# Patient Record
Sex: Female | Born: 1990 | Race: White | Hispanic: Yes | Marital: Single | Smoking: Former smoker
Health system: Southern US, Community
[De-identification: ages and names within clinical notes are randomized; demographics above are authoritative.]

## PROBLEM LIST (undated history)

## (undated) ENCOUNTER — Inpatient Hospital Stay (HOSPITAL_COMMUNITY): Payer: Self-pay

## (undated) DIAGNOSIS — F32A Depression, unspecified: Secondary | ICD-10-CM

## (undated) DIAGNOSIS — R87629 Unspecified abnormal cytological findings in specimens from vagina: Secondary | ICD-10-CM

## (undated) DIAGNOSIS — M549 Dorsalgia, unspecified: Secondary | ICD-10-CM

## (undated) DIAGNOSIS — N390 Urinary tract infection, site not specified: Secondary | ICD-10-CM

## (undated) DIAGNOSIS — M329 Systemic lupus erythematosus, unspecified: Secondary | ICD-10-CM

## (undated) DIAGNOSIS — G8929 Other chronic pain: Secondary | ICD-10-CM

## (undated) DIAGNOSIS — O24419 Gestational diabetes mellitus in pregnancy, unspecified control: Secondary | ICD-10-CM

## (undated) DIAGNOSIS — F419 Anxiety disorder, unspecified: Secondary | ICD-10-CM

## (undated) DIAGNOSIS — IMO0002 Reserved for concepts with insufficient information to code with codable children: Secondary | ICD-10-CM

## (undated) DIAGNOSIS — D649 Anemia, unspecified: Secondary | ICD-10-CM

## (undated) DIAGNOSIS — M1990 Unspecified osteoarthritis, unspecified site: Secondary | ICD-10-CM

## (undated) HISTORY — DX: Unspecified osteoarthritis, unspecified site: M19.90

## (undated) HISTORY — DX: Unspecified abnormal cytological findings in specimens from vagina: R87.629

## (undated) HISTORY — PX: HERNIA REPAIR: SHX51

## (undated) HISTORY — DX: Depression, unspecified: F32.A

---
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
## 2012-02-02 ENCOUNTER — Inpatient Hospital Stay (HOSPITAL_COMMUNITY)
Admission: AD | Admit: 2012-02-02 | Discharge: 2012-02-02 | Disposition: A | Discharge status: Home / Self Care | Payer: Self-pay | Source: Ambulatory Visit | Attending: Obstetrics & Gynecology | Admitting: Obstetrics & Gynecology

## 2012-02-02 ENCOUNTER — Inpatient Hospital Stay (HOSPITAL_COMMUNITY): Payer: Self-pay

## 2012-02-02 ENCOUNTER — Encounter (HOSPITAL_COMMUNITY): Payer: Self-pay | Admitting: *Deleted

## 2012-02-02 DIAGNOSIS — O26899 Other specified pregnancy related conditions, unspecified trimester: Secondary | ICD-10-CM

## 2012-02-02 DIAGNOSIS — O99891 Other specified diseases and conditions complicating pregnancy: Secondary | ICD-10-CM | POA: Insufficient documentation

## 2012-02-02 DIAGNOSIS — R109 Unspecified abdominal pain: Secondary | ICD-10-CM | POA: Insufficient documentation

## 2012-02-02 LAB — URINALYSIS, ROUTINE W REFLEX MICROSCOPIC
Bilirubin Urine: NEGATIVE
Glucose, UA: NEGATIVE mg/dL
Ketones, ur: NEGATIVE mg/dL
pH: 6 (ref 5.0–8.0)

## 2012-02-02 LAB — HCG, QUANTITATIVE, PREGNANCY: hCG, Beta Chain, Quant, S: 44 m[IU]/mL — ABNORMAL HIGH (ref <5)

## 2012-02-02 LAB — CBC
HCT: 32.6 % — ABNORMAL LOW (ref 36.0–46.0)
Hemoglobin: 10.5 g/dL — ABNORMAL LOW (ref 12.0–15.0)
MCV: 76.2 fL — ABNORMAL LOW (ref 78.0–100.0)
RBC: 4.28 MIL/uL (ref 3.87–5.11)
RDW: 15.7 % — ABNORMAL HIGH (ref 11.5–15.5)
WBC: 6.3 10*3/uL (ref 4.0–10.5)

## 2012-02-02 LAB — WET PREP, GENITAL
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
## 2012-02-02 NOTE — MAU Note (Signed)
Patient states she had a positive home pregnancy test. Has been having abdominal cramping, no bleeding.

## 2012-02-02 NOTE — MAU Note (Signed)
Pt asked if she can continue to breastfeed with the pregnancy-instructed tht she could per N.Frazier,CNM

## 2012-02-02 NOTE — MAU Provider Note (Signed)
Attestation of Attending Supervision of Advanced Practitioner (CNM/NP): Evaluation and management procedures were performed by the Advanced Practitioner under my supervision and collaboration.  I have reviewed the Advanced Practitioner's note and chart, and I agree with the management and plan.  HARRAWAY-SMITH, Jarquis Walker 11:40 PM   \  

## 2012-02-03 LAB — GC/CHLAMYDIA PROBE AMP, GENITAL: Chlamydia, DNA Probe: NEGATIVE

## 2012-02-05 ENCOUNTER — Inpatient Hospital Stay (HOSPITAL_COMMUNITY)
Admission: AD | Admit: 2012-02-05 | Discharge: 2012-02-05 | Disposition: A | Discharge status: Home / Self Care | Payer: Self-pay | Source: Ambulatory Visit | Attending: Obstetrics & Gynecology | Admitting: Obstetrics & Gynecology

## 2012-02-05 DIAGNOSIS — O99891 Other specified diseases and conditions complicating pregnancy: Secondary | ICD-10-CM | POA: Insufficient documentation

## 2012-02-05 DIAGNOSIS — R109 Unspecified abdominal pain: Secondary | ICD-10-CM | POA: Insufficient documentation

## 2012-02-05 DIAGNOSIS — Z09 Encounter for follow-up examination after completed treatment for conditions other than malignant neoplasm: Secondary | ICD-10-CM

## 2012-02-05 LAB — HCG, QUANTITATIVE, PREGNANCY: hCG, Beta Chain, Quant, S: 228 m[IU]/mL — ABNORMAL HIGH (ref <5)

## 2012-02-05 NOTE — MAU Note (Signed)
Pt reports cramping off/on, denies bleeding.

## 2012-02-05 NOTE — MAU Provider Note (Signed)
Tara Villanueva is a 21 y.o. female who presents to MAU for follow up Bhcg. On her previous visit 2 days ago the Bhcg was 44. Today has increased to 228. The patient has no pain or bleeding today.   Discussed with Dr. Penne Lash. Will have the patient return in 10 days for follow up ultrasound. She will continue ectopic precautions.

## 2012-02-15 NOTE — MAU Provider Note (Signed)
Attestation of Attending Supervision of Advanced Practitioner (CNM/NP): Evaluation and management procedures were performed by the Advanced Practitioner under my supervision and collaboration. I have reviewed the Advanced Practitioner's note and chart, and I agree with the management and plan.  Waneta Fitting H. 2:46 PM   

## 2012-02-17 ENCOUNTER — Inpatient Hospital Stay (HOSPITAL_COMMUNITY)
Admission: AD | Admit: 2012-02-17 | Discharge: 2012-02-17 | Disposition: A | Discharge status: Home / Self Care | Payer: Self-pay | Source: Ambulatory Visit | Attending: Obstetrics & Gynecology | Admitting: Obstetrics & Gynecology

## 2012-02-17 ENCOUNTER — Encounter (HOSPITAL_COMMUNITY): Payer: Self-pay | Admitting: *Deleted

## 2012-02-17 ENCOUNTER — Ambulatory Visit (HOSPITAL_COMMUNITY)
Admission: RE | Admit: 2012-02-17 | Discharge: 2012-02-17 | Disposition: A | Discharge status: Home / Self Care | Payer: Self-pay | Source: Ambulatory Visit | Attending: Nurse Practitioner | Admitting: Nurse Practitioner

## 2012-02-17 DIAGNOSIS — B3731 Acute candidiasis of vulva and vagina: Secondary | ICD-10-CM | POA: Insufficient documentation

## 2012-02-17 DIAGNOSIS — O3680X0 Pregnancy with inconclusive fetal viability, not applicable or unspecified: Secondary | ICD-10-CM | POA: Insufficient documentation

## 2012-02-17 DIAGNOSIS — O2390 Unspecified genitourinary tract infection in pregnancy, unspecified trimester: Secondary | ICD-10-CM | POA: Insufficient documentation

## 2012-02-17 DIAGNOSIS — B373 Candidiasis of vulva and vagina: Secondary | ICD-10-CM

## 2012-02-17 DIAGNOSIS — L293 Anogenital pruritus, unspecified: Secondary | ICD-10-CM | POA: Insufficient documentation

## 2012-02-17 DIAGNOSIS — N949 Unspecified condition associated with female genital organs and menstrual cycle: Secondary | ICD-10-CM | POA: Insufficient documentation

## 2012-02-17 DIAGNOSIS — O99891 Other specified diseases and conditions complicating pregnancy: Secondary | ICD-10-CM | POA: Insufficient documentation

## 2012-02-17 DIAGNOSIS — Z3689 Encounter for other specified antenatal screening: Secondary | ICD-10-CM | POA: Insufficient documentation

## 2012-02-17 DIAGNOSIS — Z3490 Encounter for supervision of normal pregnancy, unspecified, unspecified trimester: Secondary | ICD-10-CM

## 2012-02-17 HISTORY — DX: Anxiety disorder, unspecified: F41.9

## 2012-02-17 NOTE — MAU Provider Note (Signed)
Attestation of Attending Supervision of Advanced Practitioner (CNM/NP): Evaluation and management procedures were performed by the Advanced Practitioner under my supervision and collaboration.  I have reviewed the Advanced Practitioner's note and chart, and I agree with the management and plan.  HARRAWAY-SMITH, Evonna Stoltz 10:21 PM     

## 2012-02-17 NOTE — MAU Note (Signed)
Came after [place].  Still having cramping.  No bleeding, heavy discharge, itching and some irritation.
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
## 2012-03-17 NOTE — L&D Delivery Note (Addendum)
Delivery Note At 3:29 AM a viable and healthy female was delivered via Vaginal, Spontaneous Delivery (Presentation: ; Occiput Anterior).  APGAR: 8,9 ; weight pending.   Placenta status: Intact, Spontaneous.  Cord:  with the following complications: .    Anesthesia:  none Episiotomy: none Lacerations: none Suture Repair: none Est. Blood Loss (mL): 300  Mom to postpartum.  Baby to nursery-stable.  Tawni Carnes 10/06/2012, 3:52 AM

## 2012-03-21 ENCOUNTER — Encounter (HOSPITAL_COMMUNITY): Payer: Self-pay | Admitting: *Deleted

## 2012-03-21 ENCOUNTER — Inpatient Hospital Stay (HOSPITAL_COMMUNITY)
Admission: AD | Admit: 2012-03-21 | Discharge: 2012-03-21 | Disposition: A | Discharge status: Home / Self Care | Payer: Self-pay | Source: Ambulatory Visit | Attending: Family Medicine | Admitting: Family Medicine

## 2012-03-21 DIAGNOSIS — B373 Candidiasis of vulva and vagina: Secondary | ICD-10-CM

## 2012-03-21 DIAGNOSIS — B3731 Acute candidiasis of vulva and vagina: Secondary | ICD-10-CM | POA: Insufficient documentation

## 2012-03-21 DIAGNOSIS — N949 Unspecified condition associated with female genital organs and menstrual cycle: Secondary | ICD-10-CM | POA: Insufficient documentation

## 2012-03-21 DIAGNOSIS — R109 Unspecified abdominal pain: Secondary | ICD-10-CM | POA: Insufficient documentation

## 2012-03-21 DIAGNOSIS — Z3490 Encounter for supervision of normal pregnancy, unspecified, unspecified trimester: Secondary | ICD-10-CM

## 2012-03-21 DIAGNOSIS — O2390 Unspecified genitourinary tract infection in pregnancy, unspecified trimester: Secondary | ICD-10-CM | POA: Insufficient documentation

## 2012-03-21 LAB — URINALYSIS, ROUTINE W REFLEX MICROSCOPIC
Bilirubin Urine: NEGATIVE
Nitrite: NEGATIVE
Specific Gravity, Urine: 1.02 (ref 1.005–1.030)
Urobilinogen, UA: 0.2 mg/dL (ref 0.0–1.0)
pH: 6 (ref 5.0–8.0)

## 2012-03-21 LAB — URINE MICROSCOPIC-ADD ON

## 2012-03-21 LAB — WET PREP, GENITAL
Clue Cells Wet Prep HPF POC: NONE SEEN
Trich, Wet Prep: NONE SEEN
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
## 2012-03-21 NOTE — MAU Note (Signed)
Patient left MAU stating she was going to go talk to her husband in the waiting room and never returned. Discharge paper were not signed.

## 2012-03-21 NOTE — MAU Note (Signed)
Pt G3P2 at 10.4wks having contraction pain x 2 days, passing clear/white watery discharge.

## 2012-03-22 NOTE — MAU Provider Note (Signed)
Chart reviewed and agree with management and plan.  

## 2012-04-14 LAB — OB RESULTS CONSOLE HGB/HCT, BLOOD: HCT: 33 %

## 2012-04-14 LAB — OB RESULTS CONSOLE HIV ANTIBODY (ROUTINE TESTING): HIV: NONREACTIVE

## 2012-04-14 LAB — OB RESULTS CONSOLE ABO/RH: RH Type: POSITIVE

## 2012-04-14 LAB — OB RESULTS CONSOLE HEPATITIS B SURFACE ANTIGEN: Hepatitis B Surface Ag: NEGATIVE

## 2012-04-14 LAB — OB RESULTS CONSOLE ANTIBODY SCREEN: Antibody Screen: NEGATIVE

## 2012-07-13 LAB — OB RESULTS CONSOLE PLATELET COUNT: Platelets: 255 10*3/uL

## 2012-07-13 LAB — OB RESULTS CONSOLE HGB/HCT, BLOOD
HCT: 28 %
Hemoglobin: 9.2 g/dL

## 2012-07-20 ENCOUNTER — Inpatient Hospital Stay (HOSPITAL_COMMUNITY)
Admission: AD | Admit: 2012-07-20 | Discharge: 2012-07-20 | Disposition: A | Discharge status: Home / Self Care | Payer: Self-pay | Source: Ambulatory Visit | Attending: Obstetrics & Gynecology | Admitting: Obstetrics & Gynecology

## 2012-07-20 ENCOUNTER — Encounter (HOSPITAL_COMMUNITY): Payer: Self-pay

## 2012-07-20 DIAGNOSIS — R109 Unspecified abdominal pain: Secondary | ICD-10-CM | POA: Insufficient documentation

## 2012-07-20 DIAGNOSIS — O479 False labor, unspecified: Secondary | ICD-10-CM

## 2012-07-20 DIAGNOSIS — O99891 Other specified diseases and conditions complicating pregnancy: Secondary | ICD-10-CM | POA: Insufficient documentation

## 2012-07-20 LAB — URINALYSIS, ROUTINE W REFLEX MICROSCOPIC
Glucose, UA: NEGATIVE mg/dL
Ketones, ur: NEGATIVE mg/dL
Nitrite: NEGATIVE
Protein, ur: NEGATIVE mg/dL
pH: 6 (ref 5.0–8.0)

## 2012-07-20 LAB — URINE MICROSCOPIC-ADD ON
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
## 2012-07-20 NOTE — MAU Note (Signed)
Been contracting (braxton hicks) past couple wks. Spotting yesterday, and a lot of white discharge. No hx of PTL. Was told she is carrying low.

## 2012-07-20 NOTE — MAU Note (Signed)
Pt also notes that she has experienced swelling in her right leg only. Sunday pt states she felt baby move then right leg got numb/tingly, and pt noted a bulge on right outer portion of her calf which was very painful. Does note slight pain in calf with dorsiflexion.

## 2012-07-20 NOTE — MAU Note (Signed)
Pt states goes to Dr. Arther Abbott in [facility] for [facility], plans on delivery at [facility]. Noted spotting yesterday only. Denies burning with voiding, however does have intermittent urinary incontinence.

## 2012-07-21 LAB — URINE CULTURE: Colony Count: NO GROWTH

## 2012-07-23 NOTE — MAU Provider Note (Signed)
Attestation of Attending Supervision of Advanced Practitioner (PA/CNM/NP): Evaluation and management procedures were performed by the Advanced Practitioner under my supervision and collaboration.  I have reviewed the Advanced Practitioner's note and chart, and I agree with the management and plan.  Dorrance Sellick, MD, FACOG Attending Obstetrician & Gynecologist Faculty Practice, [facility]  

## 2012-07-30 ENCOUNTER — Inpatient Hospital Stay (HOSPITAL_COMMUNITY)
Admission: AD | Admit: 2012-07-30 | Discharge: 2012-07-30 | Disposition: A | Discharge status: Home / Self Care | Payer: Self-pay | Source: Ambulatory Visit | Attending: Obstetrics & Gynecology | Admitting: Obstetrics & Gynecology

## 2012-07-30 ENCOUNTER — Encounter (HOSPITAL_COMMUNITY): Payer: Self-pay | Admitting: *Deleted

## 2012-07-30 DIAGNOSIS — IMO0002 Reserved for concepts with insufficient information to code with codable children: Secondary | ICD-10-CM | POA: Insufficient documentation

## 2012-07-30 DIAGNOSIS — O212 Late vomiting of pregnancy: Secondary | ICD-10-CM | POA: Insufficient documentation

## 2012-07-30 DIAGNOSIS — K529 Noninfective gastroenteritis and colitis, unspecified: Secondary | ICD-10-CM

## 2012-07-30 DIAGNOSIS — R197 Diarrhea, unspecified: Secondary | ICD-10-CM | POA: Insufficient documentation

## 2012-07-30 DIAGNOSIS — K5289 Other specified noninfective gastroenteritis and colitis: Secondary | ICD-10-CM

## 2012-07-30 LAB — COMPREHENSIVE METABOLIC PANEL
Albumin: 3 g/dL — ABNORMAL LOW (ref 3.5–5.2)
Alkaline Phosphatase: 89 U/L (ref 39–117)
BUN: 10 mg/dL (ref 6–23)
Chloride: 101 mEq/L (ref 96–112)
Creatinine, Ser: 0.52 mg/dL (ref 0.50–1.10)
GFR calc Af Amer: >90 mL/min (ref >90)
Glucose, Bld: 71 mg/dL (ref 70–99)
Total Bilirubin: 0.2 mg/dL — ABNORMAL LOW (ref 0.3–1.2)
Total Protein: 7.2 g/dL (ref 6.0–8.3)

## 2012-07-30 LAB — URINALYSIS, ROUTINE W REFLEX MICROSCOPIC
Glucose, UA: NEGATIVE mg/dL
Leukocytes, UA: NEGATIVE
Nitrite: NEGATIVE
Specific Gravity, Urine: 1.02 (ref 1.005–1.030)
pH: 7 (ref 5.0–8.0)

## 2012-07-30 LAB — CBC WITH DIFFERENTIAL/PLATELET
Basophils Absolute: 0 10*3/uL (ref 0.0–0.1)
Eosinophils Relative: 0 % (ref 0–5)
Lymphocytes Relative: 20 % (ref 12–46)
Lymphs Abs: 1.8 10*3/uL (ref 0.7–4.0)
MCV: 77.4 fL — ABNORMAL LOW (ref 78.0–100.0)
Neutro Abs: 6.2 10*3/uL (ref 1.7–7.7)
Platelets: 229 10*3/uL (ref 150–400)
RBC: 3.81 MIL/uL — ABNORMAL LOW (ref 3.87–5.11)
RDW: 15.5 % (ref 11.5–15.5)
WBC: 8.8 10*3/uL (ref 4.0–10.5)

## 2012-07-30 MED ORDER — ONDANSETRON HCL 4 MG/2ML IJ SOLN
4.0000 mg | INTRAMUSCULAR | Status: AC
  Administered 2012-07-30: 4 mg via INTRAVENOUS
  Filled 2012-07-30: qty 2

## 2012-07-30 MED ORDER — PROMETHAZINE HCL 50 MG PO TABS: 50.0000 mg | ORAL_TABLET | Freq: Four times a day (QID) | ORAL | Status: DC | PRN

## 2012-07-30 MED ORDER — POTASSIUM CHLORIDE CRYS ER 20 MEQ PO TBCR
40.0000 meq | EXTENDED_RELEASE_TABLET | Freq: Once | ORAL | Status: AC
  Administered 2012-07-30: 40 meq via ORAL
  Filled 2012-07-30: qty 2

## 2012-07-30 MED ORDER — LACTATED RINGERS IV BOLUS (SEPSIS)
1000.0000 mL | INTRAVENOUS | Status: AC
  Administered 2012-07-30: 1000 mL via INTRAVENOUS

## 2012-07-30 MED ORDER — PROMETHAZINE HCL 25 MG/ML IJ SOLN: 25.0000 mg | INTRAMUSCULAR | Status: DC
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
## 2012-07-30 NOTE — MAU Note (Signed)
Pt G3 P2 at 29.2wks vomiting since Sunday.  Vomiting x 5 today.  Sees Dr. Shawnie Pons for prenatal care, being worked up for possible salmonella.

## 2012-08-23 ENCOUNTER — Encounter (HOSPITAL_COMMUNITY): Payer: Self-pay | Admitting: Emergency Medicine

## 2012-08-23 ENCOUNTER — Inpatient Hospital Stay (HOSPITAL_COMMUNITY)
Admission: EM | Admit: 2012-08-23 | Discharge: 2012-08-23 | Disposition: A | Discharge status: Home / Self Care | Payer: Self-pay | Attending: Obstetrics & Gynecology | Admitting: Obstetrics & Gynecology

## 2012-08-23 DIAGNOSIS — O4700 False labor before 37 completed weeks of gestation, unspecified trimester: Secondary | ICD-10-CM | POA: Insufficient documentation

## 2012-08-23 DIAGNOSIS — R109 Unspecified abdominal pain: Secondary | ICD-10-CM | POA: Insufficient documentation

## 2012-08-23 LAB — URINALYSIS, ROUTINE W REFLEX MICROSCOPIC
Bilirubin Urine: NEGATIVE
Leukocytes, UA: NEGATIVE
Nitrite: NEGATIVE
Specific Gravity, Urine: <1.005 — ABNORMAL LOW (ref 1.005–1.030)
Urobilinogen, UA: 0.2 mg/dL (ref 0.0–1.0)
pH: 7 (ref 5.0–8.0)

## 2012-08-23 MED ORDER — LACTATED RINGERS IV SOLN
INTRAVENOUS | Status: DC
  Administered 2012-08-23: 14:00:00 via INTRAVENOUS

## 2012-08-23 MED ORDER — LACTATED RINGERS IV BOLUS (SEPSIS)
500.0000 mL | Freq: Once | INTRAVENOUS | Status: AC
  Administered 2012-08-23: 14:00:00 via INTRAVENOUS

## 2012-08-23 MED ORDER — LACTATED RINGERS IV BOLUS (SEPSIS)
500.0000 mL | Freq: Once | INTRAVENOUS | Status: AC
  Administered 2012-08-23: 500 mL via INTRAVENOUS

## 2012-08-23 MED ORDER — TERBUTALINE SULFATE 1 MG/ML IJ SOLN
0.2500 mg | Freq: Once | INTRAMUSCULAR | Status: AC
  Administered 2012-08-23: 0.25 mg via SUBCUTANEOUS
  Filled 2012-08-23: qty 1

## 2012-08-23 MED ORDER — BETAMETHASONE SOD PHOS & ACET 6 (3-3) MG/ML IJ SUSP
12.0000 mg | Freq: Once | INTRAMUSCULAR | Status: AC
  Administered 2012-08-23: 12 mg via INTRAMUSCULAR
  Filled 2012-08-23: qty 2

## 2012-08-23 NOTE — MAU Note (Signed)
Patient arrived by [facility] from [facility] ED with c/o right flank pain and pinching pain in the abdomen. IV of LR infusing with 20 angio in the right antecubital space.

## 2012-08-23 NOTE — Progress Notes (Signed)
Report given to Lupita Leash, RN in MAU regarding pt transfer to [facility] MAU. ED RN notified as well.

## 2012-08-23 NOTE — ED Notes (Signed)
When walking out of room, noted pt had phone out with timer on and was blowing, asked patient if she was having contractions and she said yes, states 5 minutes apart

## 2012-08-23 NOTE — ED Notes (Signed)
Pt presenting to ed with c/o right side flank pain that's sharp, cramping pain pt with positive nausea and vomiting x couple days. Pt is [redacted] weeks pregnant.
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
## 2012-08-23 NOTE — Progress Notes (Signed)
Pt 32wk 5d with EDC 10/12/12 seen by Dr. Carlyon Prows in [facility]. G3P2 Pt reports postive fetal movement, pt states "not really feeling contractions," pt denies vaginal bleeding or leaking of fluid. RN reported that ED MD SVE closed. ED MD stated that pt was having contractions around 2am every 5 min. Pt states they have decreased some.

## 2012-08-23 NOTE — ED Notes (Signed)
Pt states started vomiting around midnight, was treated for UTI a few weeks ago, states she took all antibiotics, states no symptoms then so doesn't know if returned, pt states she is unable to walk, states cannot walk to bathroom, states unable to walk since around 3 am

## 2012-08-23 NOTE — Progress Notes (Signed)
Called regarding 32w pt with c/o of flank pain. OB RR RN in route.

## 2012-08-23 NOTE — ED Notes (Signed)
Sharion Dove RN attempted to in an out cath pt for urine sample and was unable.
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
## 2012-08-23 NOTE — Progress Notes (Signed)
[facility] has seen patient and provided her with a list of primary care resources. °

## 2012-08-23 NOTE — ED Notes (Signed)
Rapid response RN to bedside, connected pt to fetal monitor. Reports that pt is having contractions 3 min apart.

## 2012-08-24 ENCOUNTER — Encounter (HOSPITAL_COMMUNITY): Payer: Self-pay | Admitting: *Deleted

## 2012-08-24 ENCOUNTER — Inpatient Hospital Stay (HOSPITAL_COMMUNITY)
Admission: AD | Admit: 2012-08-24 | Discharge: 2012-08-24 | Disposition: A | Discharge status: Home / Self Care | Payer: Self-pay | Source: Ambulatory Visit | Attending: Obstetrics & Gynecology | Admitting: Obstetrics & Gynecology

## 2012-08-24 DIAGNOSIS — R109 Unspecified abdominal pain: Secondary | ICD-10-CM | POA: Insufficient documentation

## 2012-08-24 DIAGNOSIS — O4700 False labor before 37 completed weeks of gestation, unspecified trimester: Secondary | ICD-10-CM | POA: Insufficient documentation

## 2012-08-24 LAB — WET PREP, GENITAL: Clue Cells Wet Prep HPF POC: NONE SEEN

## 2012-08-24 MED ORDER — BETAMETHASONE SOD PHOS & ACET 6 (3-3) MG/ML IJ SUSP
12.0000 mg | Freq: Once | INTRAMUSCULAR | Status: AC
  Administered 2012-08-24: 12 mg via INTRAMUSCULAR
  Filled 2012-08-24: qty 2

## 2012-08-24 MED ORDER — NIFEDIPINE 10 MG PO CAPS
20.0000 mg | ORAL_CAPSULE | Freq: Once | ORAL | Status: DC
  Filled 2012-08-24: qty 2
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
## 2012-08-24 NOTE — MAU Note (Signed)
Pt states she has been feeling discharge when the baby moves

## 2012-08-24 NOTE — MAU Note (Signed)
Pt not  In room

## 2012-08-24 NOTE — MAU Note (Signed)
Patient to MAU for second dose of Betamethasone. Patient states she is having contractions every 15 minutes and more uncomfortable and has a watery discharge that is more than yesterday. Reports less fetal movement than usual.

## 2012-08-24 NOTE — Progress Notes (Signed)
Pt states she has a headache that stays around 3

## 2012-08-25 LAB — URINE CULTURE

## 2012-08-26 NOTE — MAU Provider Note (Signed)
Attestation of Attending Supervision of Advanced Practitioner (PA/CNM/NP): Evaluation and management procedures were performed by the Advanced Practitioner under my supervision and collaboration.  I have reviewed the Advanced Practitioner's note and chart, and I agree with the management and plan.  Darianny Momon, MD, FACOG Attending Obstetrician & Gynecologist Faculty Practice, [facility]  

## 2012-09-14 ENCOUNTER — Encounter: Payer: Self-pay | Admitting: *Deleted

## 2012-09-16 ENCOUNTER — Ambulatory Visit (INDEPENDENT_AMBULATORY_CARE_PROVIDER_SITE_OTHER): Payer: Self-pay | Admitting: Obstetrics & Gynecology

## 2012-09-16 ENCOUNTER — Encounter: Payer: Self-pay | Admitting: Obstetrics & Gynecology

## 2012-09-16 VITALS — BP 112/69 | Temp 97.3°F | Wt 145.8 lb

## 2012-09-16 DIAGNOSIS — O98519 Other viral diseases complicating pregnancy, unspecified trimester: Secondary | ICD-10-CM

## 2012-09-16 DIAGNOSIS — O98513 Other viral diseases complicating pregnancy, third trimester: Secondary | ICD-10-CM

## 2012-09-16 DIAGNOSIS — B009 Herpesviral infection, unspecified: Secondary | ICD-10-CM

## 2012-09-16 LAB — OB RESULTS CONSOLE GC/CHLAMYDIA: Chlamydia: NEGATIVE

## 2012-09-16 LAB — OB RESULTS CONSOLE GBS: GBS: NEGATIVE

## 2012-09-16 NOTE — Patient Instructions (Signed)

## 2012-09-16 NOTE — Progress Notes (Signed)
P=91, Here for Initial ob, c/o edema in right foot only, c/o pinch like feeling at night straight up vagina, Given new patient information.  Discussed bmi/weight gain.

## 2012-09-16 NOTE — Progress Notes (Signed)
transfer from Dr. Shawnie Pons per her request, delivered here 11 mo ago. Did not have f/u 3 hr GTT after abnl 1 hr due to scheduling, child care. GC and CT today, GBS. Advised to have GTT, avoid sugar.

## 2012-09-19 LAB — CULTURE, BETA STREP (GROUP B ONLY)

## 2012-09-23 ENCOUNTER — Ambulatory Visit (INDEPENDENT_AMBULATORY_CARE_PROVIDER_SITE_OTHER): Payer: Self-pay | Admitting: Family Medicine

## 2012-09-23 VITALS — BP 126/72 | Temp 97.6°F | Wt 147.3 lb

## 2012-09-23 DIAGNOSIS — O98519 Other viral diseases complicating pregnancy, unspecified trimester: Secondary | ICD-10-CM

## 2012-09-23 DIAGNOSIS — B009 Herpesviral infection, unspecified: Secondary | ICD-10-CM

## 2012-09-23 DIAGNOSIS — Z3483 Encounter for supervision of other normal pregnancy, third trimester: Secondary | ICD-10-CM

## 2012-09-23 DIAGNOSIS — Z23 Encounter for immunization: Secondary | ICD-10-CM

## 2012-09-23 DIAGNOSIS — Z3480 Encounter for supervision of other normal pregnancy, unspecified trimester: Secondary | ICD-10-CM | POA: Insufficient documentation

## 2012-09-23 LAB — GLUCOSE, CAPILLARY: Glucose-Capillary: 92 mg/dL (ref 70–99)

## 2012-09-23 LAB — POCT URINALYSIS DIP (DEVICE)
Bilirubin Urine: NEGATIVE
Glucose, UA: NEGATIVE mg/dL
Nitrite: NEGATIVE
Urobilinogen, UA: 0.2 mg/dL (ref 0.0–1.0)

## 2012-09-23 MED ORDER — VALACYCLOVIR HCL 1 G PO TABS: 500.0000 mg | ORAL_TABLET | Freq: Two times a day (BID) | ORAL | Status: DC

## 2012-09-23 MED ORDER — TETANUS-DIPHTH-ACELL PERTUSSIS 5-2.5-18.5 LF-MCG/0.5 IM SUSP
0.5000 mL | Freq: Once | INTRAMUSCULAR | Status: AC
  Administered 2012-09-23: 0.5 mL via INTRAMUSCULAR

## 2012-09-23 NOTE — Progress Notes (Signed)
Start suppression.  Failed 1 hour--random BS today--94 Needs TDaP

## 2012-09-23 NOTE — Patient Instructions (Addendum)
Breastfeeding A change in hormones during your pregnancy causes growth of your breast tissue and an increase in number and size of milk ducts. The hormone prolactin allows proteins, sugars, and fats from your blood supply to make breast milk in your milk-producing glands. The hormone progesterone prevents breast milk from being released before the birth of your baby. After the birth of your baby, your progesterone level decreases allowing breast milk to be released. Thoughts of your baby, as well as his or her sucking or crying, can stimulate the release of milk from the milk-producing glands. Deciding to breastfeed (nurse) is one of the best choices you can make for you and your baby. The information that follows gives a brief review of the benefits, as well as other important skills to know about breastfeeding. BENEFITS OF BREASTFEEDING For your baby  The first milk (colostrum) helps your baby's digestive system function better.   There are antibodies in your milk that help your baby fight off infections.   Your baby has a lower incidence of asthma, allergies, and sudden infant death syndrome (SIDS).   The nutrients in breast milk are better for your baby than infant formulas.  Breast milk improves your baby's brain development.   Your baby will have less gas, colic, and constipation.  Your baby is less likely to develop other conditions, such as childhood obesity, asthma, or diabetes mellitus. For you  Breastfeeding helps develop a very special bond between you and your baby.   Breastfeeding is convenient, always available at the correct temperature, and costs nothing.   Breastfeeding helps to burn calories and helps you lose the weight gained during pregnancy.   Breastfeeding makes your uterus contract back down to normal size faster and slows bleeding following delivery.   Breastfeeding mothers have a lower risk of developing osteoporosis or breast or ovarian cancer later  in life.  BREASTFEEDING FREQUENCY  A healthy, full-term baby may breastfeed as often as every hour or space his or her feedings to every 3 hours. Breastfeeding frequency will vary from baby to baby.   Newborns should be fed no less than every 2 3 hours during the day and every 4 5 hours during the night. You should breastfeed a minimum of 8 feedings in a 24 hour period.  Awaken your baby to breastfeed if it has been 3 4 hours since the last feeding.  Breastfeed when you feel the need to reduce the fullness of your breasts or when your newborn shows signs of hunger. Signs that your baby may be hungry include:  Increased alertness or activity.  Stretching.  Movement of the head from side to side.  Movement of the head and opening of the mouth when the corner of the mouth or cheek is stroked (rooting).  Increased sucking sounds, smacking lips, cooing, sighing, or squeaking.  Hand-to-mouth movements.  Increased sucking of fingers or hands.  Fussing.  Intermittent crying.  Signs of extreme hunger will require calming and consoling before you try to feed your baby. Signs of extreme hunger may include:  Restlessness.  A loud, strong cry.  Screaming.  Frequent feeding will help you make more milk and will help prevent problems, such as sore nipples and engorgement of the breasts.  BREASTFEEDING   Whether lying down or sitting, be sure that the baby's abdomen is facing your abdomen.   Support your breast with 4 fingers under your breast and your thumb above your nipple. Make sure your fingers are well away from   your nipple and your baby's mouth.   Stroke your baby's lips gently with your finger or nipple.   When your baby's mouth is open wide enough, place all of your nipple and as much of the colored area around your nipple (areola) as possible into your baby's mouth.  More areola should be visible above his or her upper lip than below his or her lower lip.  Your  baby's tongue should be between his or her lower gum and your breast.  Ensure that your baby's mouth is correctly positioned around the nipple (latched). Your baby's lips should create a seal on your breast.  Signs that your baby has effectively latched onto your nipple include:  Tugging or sucking without pain.  Swallowing heard between sucks.  Absent click or smacking sound.  Muscle movement above and in front of his or her ears with sucking.  Your baby must suck about 2 3 minutes in order to get your milk. Allow your baby to feed on each breast as long as he or she wants. Nurse your baby until he or she unlatches or falls asleep at the first breast, then offer the second breast.  Signs that your baby is full and satisfied include:  A gradual decrease in the number of sucks or complete cessation of sucking.  Falling asleep.  Extension or relaxation of his or her body.  Retention of a small amount of milk in his or her mouth.  Letting go of your breast by himself or herself.  Signs of effective breastfeeding in you include:  Breasts that have increased firmness, weight, and size prior to feeding.  Breasts that are softer after nursing.  Increased milk volume, as well as a change in milk consistency and color by the 5th day of breastfeeding.  Breast fullness relieved by breastfeeding.  Nipples are not sore, cracked, or bleeding.  If needed, break the suction by putting your finger into the corner of your baby's mouth and sliding your finger between his or her gums. Then, remove your breast from his or her mouth.  It is common for babies to spit up a small amount after a feeding.  Babies often swallow air during feeding. This can make babies fussy. Burping your baby between breasts can help with this.  Vitamin D supplements are recommended for babies who get only breast milk.  Avoid using a pacifier during your baby's first 4 6 weeks.  Avoid supplemental feedings of  water, formula, or juice in place of breastfeeding. Breast milk is all the food your baby needs. It is not necessary for your baby to have water or formula. Your breasts will make more milk if supplemental feedings are avoided during the early weeks. HOW TO TELL WHETHER YOUR BABY IS GETTING ENOUGH BREAST MILK Wondering whether or not your baby is getting enough milk is a common concern among mothers. You can be assured that your baby is getting enough milk if:   Your baby is actively sucking and you hear swallowing.   Your baby seems relaxed and satisfied after a feeding.   Your baby nurses at least 8 12 times in a 24 hour time period.  During the first 3 5 days of age:  Your baby is wetting at least 3 5 diapers in a 24 hour period. The urine should be clear and pale yellow.  Your baby is having at least 3 4 stools in a 24 hour period. The stool should be soft and yellow.  At   5 7 days of age, your baby is having at least 3 6 stools in a 24 hour period. The stool should be seedy and yellow by 5 days of age.  Your baby has a weight loss less than 7 10% during the first 3 days of age.  Your baby does not lose weight after 3 7 days of age.  Your baby gains 4 7 ounces each week after he or she is 4 days of age.  Your baby gains weight by 5 days of age and is back to birth weight within 2 weeks. ENGORGEMENT In the first week after your baby is born, you may experience extremely full breasts (engorgement). When engorged, your breasts may feel heavy, warm, or tender to the touch. Engorgement peaks within 24 48 hours after delivery of your baby.  Engorgement may be reduced by:  Continuing to breastfeed.  Increasing the frequency of breastfeeding.  Taking warm showers or applying warm, moist heat to your breasts just before each feeding. This increases circulation and helps the milk flow.   Gently massaging your breast before and during the feedings. With your fingertips, massage from  your chest wall towards your nipple in a circular motion.   Ensuring that your baby empties at least one breast at every feeding. It also helps to start the next feeding on the opposite breast.   Expressing breast milk by hand or by using a breast pump to empty the breasts if your baby is sleepy, or not nursing well. You may also want to express milk if you are returning to work oryou feel you are getting engorged.  Ensuring your baby is latched on and positioned properly while breastfeeding. If you follow these suggestions, your engorgement should improve in 24 48 hours. If you are still experiencing difficulty, call your lactation consultant or caregiver.  CARING FOR YOURSELF Take care of your breasts.  Bathe or shower daily.   Avoid using soap on your nipples.   Wear a supportive bra. Avoid wearing underwire style bras.  Air dry your nipples for a 3 4minutes after each feeding.   Use only cotton bra pads to absorb breast milk leakage. Leaking of breast milk between feedings is normal.   Use only pure lanolin on your nipples after nursing. You do not need to wash it off before feeding your baby again. Another option is to express a few drops of breast milk and gently massage that milk into your nipples.  Continue breast self-awareness checks. Take care of yourself.  Eat healthy foods. Alternate 3 meals with 3 snacks.  Avoid foods that you notice affect your baby in a bad way.  Drink milk, fruit juice, and water to satisfy your thirst (about 8 glasses a day).   Rest often, relax, and take your prenatal vitamins to prevent fatigue, stress, and anemia.  Avoid chewing and smoking tobacco.  Avoid alcohol and drug use.  Take over-the-counter and prescribed medicine only as directed by your caregiver or pharmacist. You should always check with your caregiver or pharmacist before taking any new medicine, vitamin, or herbal supplement.  Know that pregnancy is possible while  breastfeeding. If desired, talk to your caregiver about family planning and safe birth control methods that may be used while breastfeeding. SEEK MEDICAL CARE IF:   You feel like you want to stop breastfeeding or have become frustrated with breastfeeding.  You have painful breasts or nipples.  Your nipples are cracked or bleeding.  Your breasts are red, tender,   or warm.  You have a swollen area on either breast.  You have a fever or chills.  You have nausea or vomiting.  You have drainage from your nipples.  Your breasts do not become full before feedings by the 5th day after delivery.  You feel sad and depressed.  Your baby is too sleepy to eat well.  Your baby is having trouble sleeping.   Your baby is wetting less than 3 diapers in a 24 hour period.  Your baby has less than 3 stools in a 24 hour period.  Your baby's skin or the white part of his or her eyes becomes more yellow.   Your baby is not gaining weight by 5 days of age. MAKE SURE YOU:   Understand these instructions.  Will watch your condition.  Will get help right away if you are not doing well or get worse. Document Released: 03/03/2005 Document Revised: 11/26/2011 Document Reviewed: 10/08/2011 Exit[employer] Patient Information 2014 [employer].  

## 2012-09-23 NOTE — Progress Notes (Signed)
P=98, 

## 2012-09-24 ENCOUNTER — Encounter (HOSPITAL_COMMUNITY): Payer: Self-pay | Admitting: *Deleted

## 2012-09-24 ENCOUNTER — Inpatient Hospital Stay (HOSPITAL_COMMUNITY)
Admission: AD | Admit: 2012-09-24 | Discharge: 2012-09-24 | Disposition: A | Discharge status: Home / Self Care | Payer: Self-pay | Source: Ambulatory Visit | Attending: Obstetrics & Gynecology | Admitting: Obstetrics & Gynecology

## 2012-09-24 DIAGNOSIS — Z3483 Encounter for supervision of other normal pregnancy, third trimester: Secondary | ICD-10-CM

## 2012-09-24 DIAGNOSIS — O99891 Other specified diseases and conditions complicating pregnancy: Secondary | ICD-10-CM | POA: Insufficient documentation

## 2012-09-24 DIAGNOSIS — N898 Other specified noninflammatory disorders of vagina: Secondary | ICD-10-CM | POA: Insufficient documentation

## 2012-09-24 DIAGNOSIS — N949 Unspecified condition associated with female genital organs and menstrual cycle: Secondary | ICD-10-CM | POA: Insufficient documentation
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
## 2012-09-24 NOTE — MAU Note (Signed)
PT SAYS   FEELS PRESSURE  SINCE 4 PM- THEN SHE FELT SMALL AMT FLUID COME OUT- SHE SMELT IT- NOT URINE.    THEN AT 9PM- FELT MORE FLUID .  Marland Kitchen  ON ARRIVAL - NO PAD.  FEELS SOME UC- SOME STRONG.   VE YESTERDAY-   1-2 CM.   DENIES HSV AND MRSA.

## 2012-09-25 NOTE — MAU Provider Note (Signed)
Attestation of Attending Supervision of Advanced Practitioner (PA/CNM/NP): Evaluation and management procedures were performed by the Advanced Practitioner under my supervision and collaboration.  I have reviewed the Advanced Practitioner's note and chart, and I agree with the management and plan.  Jennier Schissler, MD, FACOG Attending Obstetrician & Gynecologist Faculty Practice, [facility]  

## 2012-09-29 ENCOUNTER — Ambulatory Visit (INDEPENDENT_AMBULATORY_CARE_PROVIDER_SITE_OTHER): Payer: Self-pay | Admitting: Advanced Practice Midwife

## 2012-09-29 VITALS — BP 116/72 | Temp 97.6°F | Wt 147.1 lb

## 2012-09-29 DIAGNOSIS — O98519 Other viral diseases complicating pregnancy, unspecified trimester: Secondary | ICD-10-CM

## 2012-09-29 DIAGNOSIS — O99810 Abnormal glucose complicating pregnancy: Secondary | ICD-10-CM

## 2012-09-29 DIAGNOSIS — F5089 Other specified eating disorder: Secondary | ICD-10-CM

## 2012-09-29 LAB — POCT URINALYSIS DIP (DEVICE)
Ketones, ur: NEGATIVE mg/dL
Protein, ur: NEGATIVE mg/dL
Specific Gravity, Urine: 1.02 (ref 1.005–1.030)

## 2012-09-29 LAB — GLUCOSE, CAPILLARY: Glucose-Capillary: 70 mg/dL (ref 70–99)

## 2012-09-29 NOTE — Progress Notes (Signed)
Pulse- 94 Patient reports vaginal pressure

## 2012-09-29 NOTE — Patient Instructions (Addendum)
Vaginal Delivery  Your caregiver must first be sure you are in labor. Signs of labor include:   You may pass what is called "the mucus plug" before labor begins. This is a small amount of blood stained mucus.   Regular uterine contractions.   The time between contractions get closer together.   The discomfort and pain gradually gets more intense.   Pains are mostly located in the back.   Pains get worse when walking.   The cervix (the opening of the uterus) becomes thinner (begins to efface) and opens up (dilates).  Once you are in labor and admitted into the hospital or care center, your caregiver will do the following:   A complete physical examination.   Check your vital signs (blood pressure, pulse, temperature and the fetal heart rate).   Do a vaginal examination (using a sterile glove and lubricant) to determine:   The position (presentation) of the baby (head [vertex] or buttock first).   The level (station) of the baby's head in the birth canal.   The effacement and dilatation of the cervix.   You may have your pubic hair shaved and be given an enema depending on your caregiver and the circumstance.   An electronic monitor is usually placed on your abdomen. The monitor follows the length and intensity of the contractions, as well as the baby's heart rate.   Usually, your caregiver will insert an IV in your arm with a bottle of sugar water. This is done as a precaution so that medications can be given to you quickly during labor or delivery.  NORMAL LABOR AND DELIVERY IS DIVIDED UP INTO 3 STAGES:  First Stage  This is when regular contractions begin and the cervix begins to efface and dilate. This stage can last from 3 to 15 hours. The end of the first stage is when the cervix is 100% effaced and 10 centimeters dilated. Pain medications may be given by    Injection (morphine, demerol, etc.)    Regional anesthesia (spinal, caudal or epidural, anesthetics given in different locations of the spine). Paracervical pain medication may be given, which is an injection of and anesthetic on each side of the cervix.  A pregnant woman may request to have "Natural Childbirth" which is not to have any medications or anesthesia during her labor and delivery.  Second Stage  This is when the baby comes down through the birth canal (vagina) and is born. This can take 1 to 4 hours. As the baby's head comes down through the birth canal, you may feel like you are going to have a bowel movement. You will get the urge to bear down and push until the baby is delivered. As the baby's head is being delivered, the caregiver will decide if an episiotomy (a cut in the perineum and vagina area) is needed to prevent tearing of the tissue in this area. The episiotomy is sewn up after the delivery of the baby and placenta. Sometimes a mask with nitrous oxide is given for the mother to breath during the delivery of the baby to help if there is too much pain. The end of Stage 2 is when the baby is fully delivered. Then when the umbilical cord stops pulsating it is clamped and cut.  Third Stage  The third stage begins after the baby is completely delivered and ends after the placenta (afterbirth) is delivered. This usually takes 5 to 30 minutes. After the placenta is delivered, a medication is given   either by intravenous or injection to help contract the uterus and prevent bleeding. The third stage is not painful and pain medication is usually not necessary. If an episiotomy was done, it is repaired at this time.  After the delivery, the mother is watched and monitored closely for 1 to 2 hours to make sure there is no postpartum bleeding (hemorrhage). If there is a lot of bleeding, medication is given to contract the uterus and stop the bleeding.  Document Released: 12/11/2007 Document Revised: 11/26/2011 Document Reviewed: 12/11/2007   [employer] Patient Information 2014 [employer].

## 2012-09-29 NOTE — Progress Notes (Signed)
Patient wants to be induced. States was told she needed to be delivered before her due date by Dr Debroah Loop. Had glucola of 140, but missed multiple attempts at 3hr GTT. States has been following diet. Eats rice raw/dry.  Random glucose today was 70 (was fasting).  Would not recommend IOL before 40 weeks. Refuses to take Valtrex. Does not believe she has Herpes, despite + test. Does not like to take meds.

## 2012-10-03 ENCOUNTER — Inpatient Hospital Stay (HOSPITAL_COMMUNITY)
Admission: AD | Admit: 2012-10-03 | Discharge: 2012-10-03 | Disposition: A | Discharge status: Home / Self Care | Payer: MEDICAID | Source: Ambulatory Visit | Attending: Obstetrics & Gynecology | Admitting: Obstetrics & Gynecology

## 2012-10-03 ENCOUNTER — Encounter (HOSPITAL_COMMUNITY): Payer: Self-pay | Admitting: *Deleted

## 2012-10-03 DIAGNOSIS — O368190 Decreased fetal movements, unspecified trimester, not applicable or unspecified: Secondary | ICD-10-CM | POA: Insufficient documentation

## 2012-10-03 DIAGNOSIS — O368131 Decreased fetal movements, third trimester, fetus 1: Secondary | ICD-10-CM

## 2012-10-03 NOTE — MAU Provider Note (Addendum)
Faculty Practice OB/GYN Attending Note  Subjective:  Patient presented to MAU at [redacted]w[redacted]d for decreased FM. Denies contractions, LOF or vaginal bleeding.     Objective:  Blood pressure 114/63, pulse 96, temperature 98.6 F (37 C), temperature source Oral, resp. rate 16, height 5\' 1"  (1.549 m), weight 150 lb (68.04 kg), last menstrual period 01/01/2012. FHT  Baseline 140 bpm, moderate variability, +accelerations, no decelerations Toco: No contractions Cervix: 2/70/-1, unchanged from visit last week  Assessment & Plan:  22 y.o. [id number] at [redacted]w[redacted]d who presented with decreased fetal movement, now with reactive NST. No signs/symptoms of labor.   No other complaints or concerns.  Fetal movement and labor precautions reviewed. Will discharge to home.   Jaynie Collins, MD, FACOG Attending Obstetrician & Gynecologist Faculty Practice, [facility]

## 2012-10-03 NOTE — MAU Note (Signed)
Pt presents with complaints of a decrease in fetal movement. Denies any bleeding or leakage of fluid.

## 2012-10-03 NOTE — MAU Note (Signed)
While assessing pt, she tells me she feels her baby moving well now.

## 2012-10-06 ENCOUNTER — Encounter (HOSPITAL_COMMUNITY): Payer: Self-pay | Admitting: *Deleted

## 2012-10-06 ENCOUNTER — Inpatient Hospital Stay (HOSPITAL_COMMUNITY)
Admission: AD | Admit: 2012-10-06 | Discharge: 2012-10-07 | DRG: 774 | Disposition: A | Discharge status: Home / Self Care | Payer: Medicaid Other | Source: Ambulatory Visit | Attending: Obstetrics & Gynecology | Admitting: Obstetrics & Gynecology

## 2012-10-06 DIAGNOSIS — O98519 Other viral diseases complicating pregnancy, unspecified trimester: Secondary | ICD-10-CM

## 2012-10-06 DIAGNOSIS — A6000 Herpesviral infection of urogenital system, unspecified: Secondary | ICD-10-CM | POA: Diagnosis present

## 2012-10-06 DIAGNOSIS — IMO0001 Reserved for inherently not codable concepts without codable children: Secondary | ICD-10-CM

## 2012-10-06 LAB — CBC
HCT: 31.6 % — ABNORMAL LOW (ref 36.0–46.0)
Platelets: 248 10*3/uL (ref 150–400)
RDW: 16.2 % — ABNORMAL HIGH (ref 11.5–15.5)
WBC: 11.4 10*3/uL — ABNORMAL HIGH (ref 4.0–10.5)

## 2012-10-06 LAB — TYPE AND SCREEN

## 2012-10-06 MED ORDER — WITCH HAZEL-GLYCERIN EX PADS: 1.0000 "application " | MEDICATED_PAD | CUTANEOUS | Status: DC | PRN

## 2012-10-06 MED ORDER — ONDANSETRON HCL 4 MG PO TABS: 4.0000 mg | ORAL_TABLET | ORAL | Status: DC | PRN

## 2012-10-06 MED ORDER — ONDANSETRON HCL 4 MG/2ML IJ SOLN: 4.0000 mg | Freq: Four times a day (QID) | INTRAMUSCULAR | Status: DC | PRN

## 2012-10-06 MED ORDER — OXYCODONE-ACETAMINOPHEN 5-325 MG PO TABS: 1.0000 | ORAL_TABLET | ORAL | Status: DC | PRN

## 2012-10-06 MED ORDER — DIPHENHYDRAMINE HCL 25 MG PO CAPS: 25.0000 mg | ORAL_CAPSULE | Freq: Four times a day (QID) | ORAL | Status: DC | PRN

## 2012-10-06 MED ORDER — CITRIC ACID-SODIUM CITRATE 334-500 MG/5ML PO SOLN: 30.0000 mL | ORAL | Status: DC | PRN

## 2012-10-06 MED ORDER — ONDANSETRON HCL 4 MG/2ML IJ SOLN: 4.0000 mg | INTRAMUSCULAR | Status: DC | PRN

## 2012-10-06 MED ORDER — IBUPROFEN 600 MG PO TABS: 600.0000 mg | ORAL_TABLET | Freq: Four times a day (QID) | ORAL | Status: DC | PRN

## 2012-10-06 MED ORDER — OXYTOCIN BOLUS FROM INFUSION: 500.0000 mL | INTRAVENOUS | Status: DC

## 2012-10-06 MED ORDER — ZOLPIDEM TARTRATE 5 MG PO TABS: 5.0000 mg | ORAL_TABLET | Freq: Every evening | ORAL | Status: DC | PRN

## 2012-10-06 MED ORDER — LIDOCAINE HCL (PF) 1 % IJ SOLN
30.0000 mL | INTRAMUSCULAR | Status: DC | PRN
  Filled 2012-10-06 (×2): qty 30

## 2012-10-06 MED ORDER — IBUPROFEN 600 MG PO TABS
600.0000 mg | ORAL_TABLET | Freq: Four times a day (QID) | ORAL | Status: DC
  Administered 2012-10-06: 600 mg via ORAL
  Filled 2012-10-06 (×2): qty 1

## 2012-10-06 MED ORDER — LANOLIN HYDROUS EX OINT: TOPICAL_OINTMENT | CUTANEOUS | Status: DC | PRN

## 2012-10-06 MED ORDER — SENNOSIDES-DOCUSATE SODIUM 8.6-50 MG PO TABS: 2.0000 | ORAL_TABLET | Freq: Every day | ORAL | Status: DC

## 2012-10-06 MED ORDER — OXYTOCIN 40 UNITS IN LACTATED RINGERS INFUSION - SIMPLE MED
62.5000 mL/h | INTRAVENOUS | Status: DC
  Filled 2012-10-06: qty 1000

## 2012-10-06 MED ORDER — ACETAMINOPHEN 325 MG PO TABS: 650.0000 mg | ORAL_TABLET | ORAL | Status: DC | PRN

## 2012-10-06 MED ORDER — LACTATED RINGERS IV SOLN: 500.0000 mL | INTRAVENOUS | Status: DC | PRN

## 2012-10-06 MED ORDER — PRENATAL MULTIVITAMIN CH
1.0000 | ORAL_TABLET | Freq: Every day | ORAL | Status: DC
  Filled 2012-10-06: qty 1

## 2012-10-06 MED ORDER — BENZOCAINE-MENTHOL 20-0.5 % EX AERO: 1.0000 "application " | INHALATION_SPRAY | CUTANEOUS | Status: DC | PRN

## 2012-10-06 MED ORDER — LACTATED RINGERS IV SOLN: INTRAVENOUS | Status: DC

## 2012-10-06 MED ORDER — SIMETHICONE 80 MG PO CHEW: 80.0000 mg | CHEWABLE_TABLET | ORAL | Status: DC | PRN

## 2012-10-06 MED ORDER — PRENATAL MULTIVITAMIN CH
1.0000 | ORAL_TABLET | Freq: Every day | ORAL | Status: DC
  Administered 2012-10-06 – 2012-10-07 (×2): 1 via ORAL
  Filled 2012-10-06: qty 1

## 2012-10-06 MED ORDER — TETANUS-DIPHTH-ACELL PERTUSSIS 5-2.5-18.5 LF-MCG/0.5 IM SUSP: 0.5000 mL | Freq: Once | INTRAMUSCULAR | Status: DC

## 2012-10-06 MED ORDER — IBUPROFEN 600 MG PO TABS
600.0000 mg | ORAL_TABLET | Freq: Four times a day (QID) | ORAL | Status: DC
  Administered 2012-10-06: 600 mg via ORAL
  Filled 2012-10-06 (×3): qty 1

## 2012-10-06 MED ORDER — DIBUCAINE 1 % RE OINT: 1.0000 "application " | TOPICAL_OINTMENT | RECTAL | Status: DC | PRN

## 2012-10-06 NOTE — Progress Notes (Signed)
UR chart review completed.  
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
## 2012-10-06 NOTE — MAU Note (Signed)
SVE 6-100-0 station

## 2012-10-06 NOTE — Progress Notes (Signed)
  Pt transferred to room 123 via wheelchair, newborn in mothers arms. Fob at side.

## 2012-10-07 ENCOUNTER — Encounter: Payer: Self-pay | Admitting: Family Medicine

## 2012-10-07 ENCOUNTER — Encounter: Payer: Self-pay | Admitting: Obstetrics and Gynecology

## 2012-10-07 MED ORDER — IBUPROFEN 600 MG PO TABS: 600.0000 mg | ORAL_TABLET | Freq: Four times a day (QID) | ORAL | Status: DC

## 2012-10-07 NOTE — Discharge Summary (Signed)
Obstetric Discharge Summary Reason for Admission: onset of labor Prenatal Procedures: none Intrapartum Procedures: spontaneous vaginal delivery Postpartum Procedures: none Complications-Operative and Postpartum: none Hemoglobin  Date Value Range Status  10/06/2012 10.1* 12.0 - 15.0 g/dL Final  9/52/8413 9.2   Final     HCT  Date Value Range Status  10/06/2012 31.6* 36.0 - 46.0 % Final  07/13/2012 28   Final    Physical Exam:  General: alert, cooperative and no distress Lochia: appropriate Uterine Fundus: firm DVT Evaluation: No evidence of DVT seen on physical exam. No cords or calf tenderness. No significant calf/ankle edema.  Discharge Diagnoses: Term Pregnancy-delivered  Discharge Information: Date: 10/07/2012 Activity: pelvic rest Diet: routine Medications: PNV and Ibuprofen Condition: stable Instructions: refer to practice specific booklet Discharge to: home Follow-up Information   Follow up with [facility]. Schedule an appointment as soon as possible for a visit in 4 weeks. (Call and schedule an appt for 4 weeks from now)    Contact information:   [address] [place] [address] [phone]      Newborn Data: Live born female  Birth Weight: 6 lb 1.2 oz (2755 g) APGAR: 8, 9  Home with mother.  Breastfeeding, depo for contraception  Tawni Carnes 10/07/2012, 7:26 AM  I have seen and examined this patient and I agree with the above. Cam Hai 11:35 PM 10/12/2012
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
## 2012-10-08 ENCOUNTER — Ambulatory Visit: Payer: Self-pay

## 2012-10-08 NOTE — Lactation Note (Signed)
This note was copied from the chart of Tara Villanueva. Lactation Consultation Note  Patient Name: Tara Villanueva [id number] Date: 10/08/2012 Reason for consult: Follow-up assessment   Maternal Data    Feeding   LATCH Score/Interventions             Problem noted: Filling;Mild/Moderate discomfort        Lactation Tools Discussed/Used     Consult Status Consult Status: Complete  Experienced BF mom reports that baby is nursing well. Reports that her breasts are feeling fuller since last night. Reports that baby is able to soften breasts. No questions at present. To call prn  Pamelia Hoit 10/08/2012, 9:29 AM

## 2012-11-08 ENCOUNTER — Ambulatory Visit: Payer: Self-pay | Admitting: Obstetrics and Gynecology

## 2012-11-12 ENCOUNTER — Ambulatory Visit: Payer: Self-pay | Admitting: Obstetrics and Gynecology

## 2013-09-23 ENCOUNTER — Encounter (HOSPITAL_COMMUNITY): Payer: Self-pay | Admitting: Emergency Medicine

## 2013-09-23 ENCOUNTER — Emergency Department (HOSPITAL_COMMUNITY)
Admission: EM | Admit: 2013-09-23 | Discharge: 2013-09-23 | Disposition: A | Discharge status: Other Acute Inpatient Hospital | Payer: No Typology Code available for payment source | Attending: Emergency Medicine | Admitting: Emergency Medicine

## 2013-09-23 ENCOUNTER — Inpatient Hospital Stay (HOSPITAL_COMMUNITY)
Admission: AD | Admit: 2013-09-23 | Discharge: 2013-09-26 | DRG: 885 | Disposition: A | Discharge status: Home / Self Care | Payer: No Typology Code available for payment source | Source: Intra-hospital | Attending: Psychiatry | Admitting: Psychiatry

## 2013-09-23 ENCOUNTER — Encounter (HOSPITAL_COMMUNITY): Payer: Self-pay

## 2013-09-23 DIAGNOSIS — T391X1A Poisoning by 4-Aminophenol derivatives, accidental (unintentional), initial encounter: Secondary | ICD-10-CM

## 2013-09-23 DIAGNOSIS — F332 Major depressive disorder, recurrent severe without psychotic features: Secondary | ICD-10-CM | POA: Diagnosis present

## 2013-09-23 DIAGNOSIS — Z87891 Personal history of nicotine dependence: Secondary | ICD-10-CM | POA: Insufficient documentation

## 2013-09-23 DIAGNOSIS — F32A Depression, unspecified: Secondary | ICD-10-CM

## 2013-09-23 DIAGNOSIS — T391X4A Poisoning by 4-Aminophenol derivatives, undetermined, initial encounter: Secondary | ICD-10-CM

## 2013-09-23 DIAGNOSIS — M549 Dorsalgia, unspecified: Secondary | ICD-10-CM | POA: Diagnosis present

## 2013-09-23 DIAGNOSIS — F3289 Other specified depressive episodes: Secondary | ICD-10-CM | POA: Insufficient documentation

## 2013-09-23 DIAGNOSIS — G8929 Other chronic pain: Secondary | ICD-10-CM | POA: Insufficient documentation

## 2013-09-23 DIAGNOSIS — Z825 Family history of asthma and other chronic lower respiratory diseases: Secondary | ICD-10-CM | POA: Diagnosis not present

## 2013-09-23 DIAGNOSIS — T391X2S Poisoning by 4-Aminophenol derivatives, intentional self-harm, sequela: Secondary | ICD-10-CM

## 2013-09-23 DIAGNOSIS — Y929 Unspecified place or not applicable: Secondary | ICD-10-CM | POA: Insufficient documentation

## 2013-09-23 DIAGNOSIS — Z87440 Personal history of urinary (tract) infections: Secondary | ICD-10-CM | POA: Insufficient documentation

## 2013-09-23 DIAGNOSIS — M950 Acquired deformity of nose: Secondary | ICD-10-CM

## 2013-09-23 DIAGNOSIS — IMO0002 Reserved for concepts with insufficient information to code with codable children: Secondary | ICD-10-CM

## 2013-09-23 DIAGNOSIS — F411 Generalized anxiety disorder: Secondary | ICD-10-CM | POA: Diagnosis present

## 2013-09-23 DIAGNOSIS — F322 Major depressive disorder, single episode, severe without psychotic features: Secondary | ICD-10-CM | POA: Diagnosis present

## 2013-09-23 DIAGNOSIS — Z833 Family history of diabetes mellitus: Secondary | ICD-10-CM | POA: Diagnosis not present

## 2013-09-23 DIAGNOSIS — Z862 Personal history of diseases of the blood and blood-forming organs and certain disorders involving the immune mechanism: Secondary | ICD-10-CM | POA: Insufficient documentation

## 2013-09-23 DIAGNOSIS — F329 Major depressive disorder, single episode, unspecified: Secondary | ICD-10-CM | POA: Insufficient documentation

## 2013-09-23 DIAGNOSIS — Z8619 Personal history of other infectious and parasitic diseases: Secondary | ICD-10-CM | POA: Insufficient documentation

## 2013-09-23 DIAGNOSIS — T50902A Poisoning by unspecified drugs, medicaments and biological substances, intentional self-harm, initial encounter: Secondary | ICD-10-CM

## 2013-09-23 DIAGNOSIS — Y9389 Activity, other specified: Secondary | ICD-10-CM | POA: Insufficient documentation

## 2013-09-23 HISTORY — DX: Poisoning by 4-aminophenol derivatives, accidental (unintentional), initial encounter: T39.1X1A

## 2013-09-23 LAB — COMPREHENSIVE METABOLIC PANEL
ALBUMIN: 4.1 g/dL (ref 3.5–5.2)
ALK PHOS: 87 U/L (ref 39–117)
ALT: 25 U/L (ref 0–35)
ALT: 19 U/L (ref 0–35)
ANION GAP: 16 — AB (ref 5–15)
ANION GAP: 14 (ref 5–15)
AST: 41 U/L — ABNORMAL HIGH (ref 0–37)
AST: 23 U/L (ref 0–37)
Albumin: 3.7 g/dL (ref 3.5–5.2)
Alkaline Phosphatase: 92 U/L (ref 39–117)
BILIRUBIN TOTAL: 0.6 mg/dL (ref 0.3–1.2)
BUN: 12 mg/dL (ref 6–23)
BUN: 11 mg/dL (ref 6–23)
CALCIUM: 9.3 mg/dL (ref 8.4–10.5)
CHLORIDE: 105 meq/L (ref 96–112)
CO2: 18 mEq/L — ABNORMAL LOW (ref 19–32)
CO2: 19 meq/L (ref 19–32)
Calcium: 9 mg/dL (ref 8.4–10.5)
Chloride: 102 mEq/L (ref 96–112)
Creatinine, Ser: 0.65 mg/dL (ref 0.50–1.10)
Creatinine, Ser: 0.66 mg/dL (ref 0.50–1.10)
GFR calc non Af Amer: >90 mL/min (ref >90)
GFR calc non Af Amer: >90 mL/min (ref >90)
GLUCOSE: 116 mg/dL — AB (ref 70–99)
GLUCOSE: 115 mg/dL — AB (ref 70–99)
Potassium: 4.3 mEq/L (ref 3.7–5.3)
Potassium: 3.7 mEq/L (ref 3.7–5.3)
SODIUM: 136 meq/L — AB (ref 137–147)
Sodium: 138 mEq/L (ref 137–147)
TOTAL PROTEIN: 8.1 g/dL (ref 6.0–8.3)
TOTAL PROTEIN: 7.6 g/dL (ref 6.0–8.3)
Total Bilirubin: 0.6 mg/dL (ref 0.3–1.2)

## 2013-09-23 LAB — CBC WITH DIFFERENTIAL/PLATELET
BASOS PCT: 1 % (ref 0–1)
Basophils Absolute: 0.1 10*3/uL (ref 0.0–0.1)
EOS ABS: 0 10*3/uL (ref 0.0–0.7)
EOS PCT: 0 % (ref 0–5)
HCT: 38.7 % (ref 36.0–46.0)
Hemoglobin: 12.9 g/dL (ref 12.0–15.0)
LYMPHS ABS: 1 10*3/uL (ref 0.7–4.0)
Lymphocytes Relative: 20 % (ref 12–46)
MCH: 25.3 pg — AB (ref 26.0–34.0)
MCHC: 33.3 g/dL (ref 30.0–36.0)
MCV: 75.9 fL — AB (ref 78.0–100.0)
MONOS PCT: 6 % (ref 3–12)
Monocytes Absolute: 0.3 10*3/uL (ref 0.1–1.0)
NEUTROS PCT: 73 % (ref 43–77)
Neutro Abs: 3.5 10*3/uL (ref 1.7–7.7)
PLATELETS: 248 10*3/uL (ref 150–400)
RBC: 5.1 MIL/uL (ref 3.87–5.11)
RDW: 15.8 % — ABNORMAL HIGH (ref 11.5–15.5)
WBC: 4.8 10*3/uL (ref 4.0–10.5)

## 2013-09-23 LAB — URINALYSIS, ROUTINE W REFLEX MICROSCOPIC
Bilirubin Urine: NEGATIVE
GLUCOSE, UA: NEGATIVE mg/dL
HGB URINE DIPSTICK: NEGATIVE
Ketones, ur: NEGATIVE mg/dL
Nitrite: NEGATIVE
PH: 6 (ref 5.0–8.0)
PROTEIN: 30 mg/dL — AB
Urobilinogen, UA: 0.2 mg/dL (ref 0.0–1.0)

## 2013-09-23 LAB — RAPID URINE DRUG SCREEN, HOSP PERFORMED
Amphetamines: NOT DETECTED
Barbiturates: NOT DETECTED
Benzodiazepines: NOT DETECTED
Cocaine: NOT DETECTED
OPIATES: NOT DETECTED
Tetrahydrocannabinol: NOT DETECTED

## 2013-09-23 LAB — URINE MICROSCOPIC-ADD ON

## 2013-09-23 LAB — ACETAMINOPHEN LEVEL
ACETAMINOPHEN (TYLENOL), SERUM: 43 ug/mL — AB (ref 10–30)
Acetaminophen (Tylenol), Serum: 19.8 ug/mL (ref 10–30)

## 2013-09-23 LAB — PREGNANCY, URINE: Preg Test, Ur: NEGATIVE

## 2013-09-23 LAB — SALICYLATE LEVEL
Salicylate Lvl: <2 mg/dL — ABNORMAL LOW (ref 2.8–20.0)
Salicylate Lvl: <2 mg/dL — ABNORMAL LOW (ref 2.8–20.0)

## 2013-09-23 LAB — ETHANOL

## 2013-09-23 LAB — PROTIME-INR
INR: 1.14 (ref 0.00–1.49)
Prothrombin Time: 14.6 seconds (ref 11.6–15.2)

## 2013-09-23 MED ORDER — IBUPROFEN 200 MG PO TABS: 600.0000 mg | ORAL_TABLET | Freq: Three times a day (TID) | ORAL | Status: DC | PRN

## 2013-09-23 MED ORDER — MAGNESIUM HYDROXIDE 400 MG/5ML PO SUSP: 30.0000 mL | Freq: Every day | ORAL | Status: DC | PRN

## 2013-09-23 MED ORDER — ONDANSETRON HCL 4 MG PO TABS: 4.0000 mg | ORAL_TABLET | Freq: Three times a day (TID) | ORAL | Status: DC | PRN

## 2013-09-23 MED ORDER — ONDANSETRON HCL 4 MG/2ML IJ SOLN
4.0000 mg | INTRAMUSCULAR | Status: DC | PRN
  Filled 2013-09-23: qty 2

## 2013-09-23 MED ORDER — ALUM & MAG HYDROXIDE-SIMETH 200-200-20 MG/5ML PO SUSP: 30.0000 mL | ORAL | Status: DC | PRN

## 2013-09-23 MED ORDER — SODIUM CHLORIDE 0.9 % IV SOLN
INTRAVENOUS | Status: DC
  Administered 2013-09-23: 10:00:00 via INTRAVENOUS

## 2013-09-23 MED ORDER — SODIUM CHLORIDE 0.9 % IV BOLUS (SEPSIS)
500.0000 mL | Freq: Once | INTRAVENOUS | Status: AC
  Administered 2013-09-23: 500 mL via INTRAVENOUS

## 2013-09-23 MED ORDER — TRAZODONE HCL 50 MG PO TABS
50.0000 mg | ORAL_TABLET | Freq: Every day | ORAL | Status: DC
  Filled 2013-09-23 (×5): qty 1

## 2013-09-23 MED ORDER — LORAZEPAM 1 MG PO TABS: 1.0000 mg | ORAL_TABLET | Freq: Three times a day (TID) | ORAL | Status: DC | PRN

## 2013-09-23 NOTE — ED Notes (Signed)
Initial contact-pt A&Ox4. Moving all extremities equally. Speaking full, clear sentences. PIV clean, dry, patent, and intact. Pt has no other questions/concerns. Plan of care explained to patient. In NAD. Denies nausea or pain at this time.

## 2013-09-23 NOTE — ED Notes (Addendum)
Poison controlled called, not recommended to treat with mucomyst at this time.
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
## 2013-09-23 NOTE — ED Notes (Signed)
Pt at this time denies nausea. Reports it went away

## 2013-09-23 NOTE — ED Notes (Addendum)
Poison control called and spoke with Roshana.  Recommended pt get an EKG, cardiac monitor, CMP, tylenol level. Wait until we get tylenol level before possibly administering mucomyst.

## 2013-09-23 NOTE — Progress Notes (Signed)
[facility] CL provided pt with a [facility] [employer] Card application, highlighting [facility], to help patient establish primary care.  °

## 2013-09-23 NOTE — ED Notes (Signed)
No In and Out was done

## 2013-09-23 NOTE — ED Notes (Signed)
Bed: RESB Expected date:  Expected time:  Means of arrival:  Comments: EMS-OD 

## 2013-09-23 NOTE — ED Notes (Addendum)
Pt refuses nausea medication at this time. Does give consent for friend to visit. Does not want Connye BurkittJose Ramirez to have any information.

## 2013-09-23 NOTE — ED Notes (Addendum)
Poison control has signed off on patient d/t therapeutic acetaminophen level. Updated on patient's neuro status and current labs. No other questions/concerns.

## 2013-09-23 NOTE — ED Notes (Signed)
Patient ambulatory to secure hold ED. Belongings include 2 necklaces, license and one bracelet. Accompanied with NT.

## 2013-09-23 NOTE — ED Notes (Signed)
Patient ate 75 % of her lunch .
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
## 2013-09-23 NOTE — ED Notes (Signed)
[facility]. She left in no acute distress.
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
## 2013-09-24 NOTE — [facility] Group Notes (Signed)
[facility] Group Notes:  (Clinical Social Work)  09/24/2013   1:15-2:15PM  Summary of Progress/Problems:   The main focus of today's process group was for the patient to identify ways in which they have sabotaged their own mental health wellness/recovery.  Motivational interviewing and a handout were used to explore the benefits and costs of their self-sabotaging behavior as well as the benefits and costs of changing this behavior.  The Stages of Change were explained to the group using a handout, and patients identified where they are with regard to changing self-defeating behaviors.  The patient expressed she self-sabotages with people pleasing, putting everybody else before herself, and blaming herself if anything goes wrong.  She was very vocal in group.  Type of Therapy:  Process Group  Participation Level:  Active  Participation Quality:  Attentive, Sharing and Supportive  Affect:  Blunted  Cognitive:  Appropriate and Oriented  Insight:  Engaged  Engagement in Therapy:  Engaged  Modes of Intervention:  Education, Motivational Interviewing   Ambrose MantleMareida Grossman-Orr, LCSW 09/24/2013, 4:00pm

## 2013-09-24 NOTE — H&P (Signed)
I agree with the assessment and plan as noted
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
## 2013-09-24 NOTE — Progress Notes (Signed)
Tara Villanueva remains OOB UAL without diff today, tolerated well.. She is flat, quiet and avoidant.    A She completes her morning assessment and on it she writes  She denies SI within the past 24 hrs, she rates her depressionn and hopelessness  0-1 / 0-1  And says her DC planning is to " move to [place]".   R Safety is in place.

## 2013-09-24 NOTE — Progress Notes (Signed)
Psychoeducational Group Note  Date:  09/24/2013 Time: 0930  Group Topic/Focus:  Goals Group:   The focus of this group is to help patients establish daily goals to achieve during treatment and discuss how the patient can incorporate goal setting into their daily lives to aide in recovery.  Participation Level: Did Not Attend  Participation Quality:  Not Applicable  Affect:  Not Applicable  Cognitive:  Not Applicable  Insight:  Not Applicable  Engagement in Group: Not Applicable  Additional Comments:    Rich BraveDuke, Ottavio Norem Lynn 09/24/2013, 12:48 PM
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
## 2013-09-24 NOTE — Tx Team (Signed)
Initial Interdisciplinary Treatment Plan  PATIENT STRENGTHS: (choose at least two) Capable of independent living General fund of knowledge Supportive family/friends  PATIENT STRESSORS: Educational concerns Financial difficulties   PROBLEM LIST: Problem List/Patient Goals Date to be addressed Date deferred Reason deferred Estimated date of resolution  Anxiety 09/24/13     Marriage problems 09/24/13     Depression 09/24/13     Education 09/24/13                                    DISCHARGE CRITERIA:  Adequate post-discharge living arrangements Improved stabilization in mood, thinking, and/or behavior Motivation to continue treatment in a less acute level of care  PRELIMINARY DISCHARGE PLAN: Attend aftercare/continuing care group Outpatient therapy  PATIENT/FAMIILY INVOLVEMENT: This treatment plan has been presented to and reviewed with the patient, Gypsy BalsamKatherine Navarro.  The patient and family have been given the opportunity to ask questions and make suggestions.  Jacques Navyhillips, Faithlyn Recktenwald A 09/24/2013, 5:17 AM
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
## 2013-09-24 NOTE — Progress Notes (Signed)
Psychoeducational Group Note  Date: 09/24/2013 Time: 1015  Group Topic/Focus:  Identifying Needs:   The focus of this group is to help patients identify their personal needs that have been historically problematic and identify healthy behaviors to address their needs.  Participation Level:  Active  Participation Quality:  Appropriate  Affect:  Appropriate  Cognitive:  Appropriate  Insight:  Engaged  Engagement in Group:  Engaged  Additional Comments:    7/11/20151:08 PM Lorelei Heikkila, Joie BimlerPatricia Lynn

## 2013-09-25 MED ORDER — CLOTRIMAZOLE 1 % VA CREA
1.0000 | TOPICAL_CREAM | Freq: Every day | VAGINAL | Status: DC
  Administered 2013-09-25: 1 via VAGINAL
  Filled 2013-09-25: qty 45

## 2013-09-25 MED ORDER — CLOTRIMAZOLE 1 % EX CREA: TOPICAL_CREAM | Freq: Two times a day (BID) | CUTANEOUS | Status: DC

## 2013-09-25 NOTE — Progress Notes (Signed)
D Natalia LeatherwoodKatherine is UAL on the unit tolerated well. She says she is going home today and when MD explains to her that she will not go today, she gets very upset. After processing with this nurse, she is able to verbalize that she accepts she is not going home, that she will have to speak to MD  Tomorrow and  Treatment team and MD will reevaluate her situation and her behavior.   A Pt contacts for safety, denies SI and rates her depression and hopelessness  " 0/0 .   R Safety is in place nad poc moves forward.

## 2013-09-25 NOTE — Progress Notes (Signed)
Adult Psychoeducational Group Note  Date:  09/25/2013 Time:  10:20 PM  Group Topic/Focus:  Wrap-Up Group:   The focus of this group is to help patients review their daily goal of treatment and discuss progress on daily workbooks.  Participation Level:  Active  Participation Quality:  Appropriate and Attentive  Affect:  Appropriate  Cognitive:  Alert and Appropriate  Insight: Appropriate  Engagement in Group:  Engaged  Modes of Intervention:  Discussion, Education and Support  Additional Comments: Pt was active during this group and rated her day at a 10 out of 10. Pt stated she felt happy throughout the day and looks forward to going home and being with her kids.   Tara MoanJeffers, [name] 09/25/2013, 10:20 PM

## 2013-09-25 NOTE — Progress Notes (Signed)
[facility] MD Progress Note  09/25/2013 11:18 AM Tara Villanueva  MRN:  [id number]  Subjective:  "I am happy"   Spoke with pt and chart was reviewed. Pt has been active on the unit and attending groups.   One child is with her grandmother and the other 2 daughters are with her husband. States she really wants to see her children. She is anxious to see them. Denies depression. States her medicine is her kids. Wants to be d/c to be with her kids and get back to real life.   States she was upset and took the pills to help her sleep. After she took the pills the realized it might be too much. So she told her husband to take care of the kids and she might be knocked out. Pt continues to deny that it was a suicide attempt.   After d/c she is willing to f/up outpatient with a psychiatrist.   Diagnosis:  Mood disorder NOS  Total Time spent with patient: 30 minutes    ADL's:  Intact  Sleep: Fair  Appetite:  Fair  Suicidal Ideation:  denies Homicidal Ideation:  Denies  AEB (as evidenced by):  Psychiatric Specialty Exam: Physical Exam  ROS  Blood pressure 120/63, pulse 123, temperature 98.2 F (36.8 C), temperature source Oral, resp. rate 18, height 5' 2"  (1.575 m), weight 78.472 kg (173 lb), unknown if currently breastfeeding.Body mass index is 31.63 kg/(m^2).  General Appearance: Neat  Eye Contact::  Good  Speech:  Clear and Coherent  Volume:  Normal  Mood:  Euthymic  Affect:  Full Range  Thought Process:  Goal Directed  Orientation:  Full (Time, Place, and Person)  Thought Content:  WDL  Suicidal Thoughts:  No  Homicidal Thoughts:  No  Memory:  Immediate;   Good Recent;   Good Remote;   Good  Judgement:  Poor  Insight:  Fair  Psychomotor Activity:  Normal  Concentration:  Good  Recall:  Good  Fund of Knowledge:Good  Language: Good  Akathisia:  No  Handed:  Right  AIMS (if indicated):     Assets:  Communication Skills Desire for Improvement Social  Support Transportation  Sleep:  Number of Hours: 6.75   Musculoskeletal: Strength & Muscle Tone: within normal limits Gait & Station: normal Patient leans: N/A  Current Medications: Current Facility-Administered Medications  Medication Dose Route Frequency Provider Last Rate Last Dose  . alum & mag hydroxide-simeth (MAALOX/MYLANTA) 200-200-20 MG/5ML suspension 30 mL  30 mL Oral Q4H PRN Waylan Boga, NP      . magnesium hydroxide (MILK OF MAGNESIA) suspension 30 mL  30 mL Oral Daily PRN Waylan Boga, NP      . traZODone (DESYREL) tablet 50 mg  50 mg Oral QHS Waylan Boga, NP        Lab Results:  Results for orders placed during the hospital encounter of 09/23/13 (from the past 48 hour(s))  COMPREHENSIVE METABOLIC PANEL     Status: Abnormal   Collection Time    09/23/13 11:21 AM      Result Value Ref Range   Sodium 138  137 - 147 mEq/L   Potassium 3.7  3.7 - 5.3 mEq/L   Chloride 105  96 - 112 mEq/L   CO2 19  19 - 32 mEq/L   Glucose, Bld 115 (*) 70 - 99 mg/dL   BUN 11  6 - 23 mg/dL   Creatinine, Ser 0.66  0.50 - 1.10 mg/dL   Calcium 9.0  8.4 -  10.5 mg/dL   Total Protein 7.6  6.0 - 8.3 g/dL   Albumin 3.7  3.5 - 5.2 g/dL   AST 23  0 - 37 U/L   ALT 19  0 - 35 U/L   Alkaline Phosphatase 87  39 - 117 U/L   Total Bilirubin 0.6  0.3 - 1.2 mg/dL   GFR calc non Af Amer >90  >90 mL/min   GFR calc Af Amer >90  >90 mL/min   Comment: (NOTE)     The eGFR has been calculated using the CKD EPI equation.     This calculation has not been validated in all clinical situations.     eGFR's persistently <90 mL/min signify possible Chronic Kidney     Disease.   Anion gap 14  5 - 15  SALICYLATE LEVEL     Status: Abnormal   Collection Time    09/23/13 11:21 AM      Result Value Ref Range   Salicylate Lvl <4.0 (*) 2.8 - 20.0 mg/dL  ACETAMINOPHEN LEVEL     Status: None   Collection Time    09/23/13 11:21 AM      Result Value Ref Range   Acetaminophen (Tylenol), Serum 19.8  10 - 30 ug/mL    Comment:            THERAPEUTIC CONCENTRATIONS VARY     SIGNIFICANTLY. A RANGE OF 10-30     ug/mL MAY BE AN EFFECTIVE     CONCENTRATION FOR MANY PATIENTS.     HOWEVER, SOME ARE BEST TREATED     AT CONCENTRATIONS OUTSIDE THIS     RANGE.     ACETAMINOPHEN CONCENTRATIONS     >150 ug/mL AT 4 HOURS AFTER     INGESTION AND >50 ug/mL AT 12     HOURS AFTER INGESTION ARE     OFTEN ASSOCIATED WITH TOXIC     REACTIONS.    Physical Findings: AIMS: Facial and Oral Movements Muscles of Facial Expression: None, normal Lips and Perioral Area: None, normal Jaw: None, normal Tongue: None, normal,Extremity Movements Upper (arms, wrists, hands, fingers): None, normal Lower (legs, knees, ankles, toes): None, normal, Trunk Movements Neck, shoulders, hips: None, normal, Overall Severity Severity of abnormal movements (highest score from questions above): None, normal Incapacitation due to abnormal movements: None, normal Patient's awareness of abnormal movements (rate only patient's report): No Awareness, Dental Status Current problems with teeth and/or dentures?: Yes (cavities) Does patient usually wear dentures?: No  CIWA:  CIWA-Ar Total: 0 COWS:  COWS Total Score: 1  Assessment: Unclear if pt was attempting suicide by OD. ED notes indicate pt told her husband to "take care of the kids" after she took the pills. Pt continues to deny that it was a suicide attempt. New onset stressor is that husband has taken 2 of her children to an unknown location.   Treatment Plan Summary: Daily contact with patient to assess and evaluate symptoms and progress in treatment  Plan: Continue Trazodone 87m po qHS for sleep Pt is refusing treatment with an antidepressant at this time May consider d/c on Monday if can get collateral from her grandmother.   Medical Decision Making Problem Points:  Established problem, stable/improving (1) and Review of psycho-social stressors (1) Data Points:  Review of medication  regiment & side effects (2)  I certify that inpatient services furnished can reasonably be expected to improve the patient's condition.   Marquese Burkland, SLauderdale7/02/2014, 11:18 AM

## 2013-09-25 NOTE — Progress Notes (Signed)
Writer spoke with patient 1:1 in her room. She was observed sitting on the bed crying.Writer asked what was wrong and she reported that she needed to be discharged b/c her husband was taking the children. She reported to Clinical research associatewriter that she was told across the street at the ED that she could leave after being here 24 hours. Writer encouraged her to call and talk with family members to find out about her children but she reports that she already knows that he has them. Writer encouraged her to try and rest and once discharged she can work on this situation. She denies si/hi/a/v hallucinations. Safety maintained on unit with 15 min checks.

## 2013-09-25 NOTE — Progress Notes (Signed)
[facility] Group Notes:  (Nursing/MHT/Case Management/Adjunct)  Date:  09/25/2013  Time:  12:45 AM  Type of Therapy:  Psychoeducational Skills  Participation Level:  Active  Participation Quality:  Appropriate  Affect:  Depressed  Cognitive:  Appropriate  Insight:  Improving  Engagement in Group:  Improving  Modes of Intervention:  Education  Summary of Progress/Problems: The patient stated in group that she had a pretty good day despite the fact that her discharge was cancelled. She states that the staff across the street in the emergency room told her that she would be discharged today. In terms of the theme for the day, her coping skills are to be with her children.   Hazle CocaGOODMAN, Gloriana Piltz S 09/25/2013, 12:45 AM

## 2013-09-25 NOTE — Progress Notes (Signed)
Psychoeducational Group Note  Date:  09/25/2013 Time:  1015  Group Topic/Focus:  Making Healthy Choices:   The focus of this group is to help patients identify negative/unhealthy choices they were using prior to admission and identify positive/healthier coping strategies to replace them upon discharge.  Participation Level:  Active  Participation Quality:  Appropriate  Affect:  Appropriate  Cognitive:  Oriented  Insight:  Improving  Engagement in Group:  Engaged  Additional Comments:    Leathia Farnell A 09/25/2013 

## 2013-09-25 NOTE — [facility] Group Notes (Signed)
[facility] Group Notes:  (Clinical Social Work)  09/25/2013   1:15-2:15PM  Summary of Progress/Problems:  The main focus of today's process group was to   identify the patient's current support system and decide on other supports that can be put in place.  The picture on workbook was used to discuss why additional supports are needed.  An emphasis was placed on using counselor, doctor, therapy groups, 12-step groups, and problem-specific support groups to expand supports.   There was also an extensive discussion about what constitutes a healthy support versus an unhealthy support.  The patient expressed full comprehension of the concepts presented.  Current healthy supports include her grandmother and her kids.  She states that around her kids, nothing bad can happen to her because she develops "super powers".  She is willing to add a therapist, and possibly go to the church she has previously been at locally which is not judgmental.  She also stated that there is a group she used to attend with her grandma, and was very impressed by their level of support.  She may be willing to go there.  Type of Therapy:  Process Group  Participation Level:  Active  Participation Quality:  Attentive and Sharing  Affect:  Blunted  Cognitive:  Appropriate and Oriented  Insight:  Engaged  Engagement in Therapy:  Engaged  Modes of Intervention:  Education,  Support and [employer]  Nyema Hachey Grossman-Orr, LCSW 09/25/2013, 4:00pm

## 2013-09-25 NOTE — Progress Notes (Signed)
Psychoeducational Group Note  Date: 09/25/2013  Time: 0930  Group Topic/Focus:  Gratefulness: The focus of this group is to help patients identify what two things they are most grateful for in their lives. What helps ground them and to center them on their work to their recovery.  Participation Level: Active  Participation Quality: Appropriate  Affect: Appropriate  Cognitive: Oriented  Insight: Improving  Engagement in Group: Engaged  Additional Comments:  Romaine Maciolek A       

## 2013-09-26 DIAGNOSIS — F332 Major depressive disorder, recurrent severe without psychotic features: Principal | ICD-10-CM

## 2013-09-26 DIAGNOSIS — F411 Generalized anxiety disorder: Secondary | ICD-10-CM

## 2013-09-26 MED ORDER — CLOTRIMAZOLE 1 % VA CREA: 1.0000 | TOPICAL_CREAM | Freq: Every day | VAGINAL | Status: DC

## 2013-09-26 NOTE — [facility] Group Notes (Signed)
[facility]   09/26/2013 11:47 AM    Participation Quality:  Appropraite  Mood/Affect:  Appropriate  Depression Rating:  0  Anxiety Rating:  0  Thoughts of Suicide:  No  Will you contract for safety?   NA  Current AVH:  No  Plan for Discharge/Comments:  Patient attended discharge planning group and actively participated in group.  Patient will return to her home and [facility]. CSW provided all participants with daily workbook.   Transportation Means: Patient has transportation.   Supports:  Patient has a support system.   Calli Bashor, Joesph JulyQuylle Hairston

## 2013-09-26 NOTE — Progress Notes (Signed)
D:  Patient's self inventory sheet, patient has excellent sleep, good appetite, high energy level, good attention span.  Denied depression, anxiety and hopeless.  Denied withdrawals.  Denied SI.  Denied physical problems.  Plans to love herself first to love others better.  Looking to discharge soon.  Does have discharge plans.  No problems with medications after discharge. A:  Medications administered per MD orders.  Emotional support and encouragement given patient. R:  Denied SI and HI.  Denied A/V hallucinations.  Safety maintained with 15 minute checks.

## 2013-09-26 NOTE — Progress Notes (Signed)
Discharge Note:  Patient discharged home with family members.  Denied SI and HI.  Denied A/V hallucinations.  Denied pain.  Suicide prevention information given and discussed with patient who stated she understood and had no questions.  Patient stated she received all her belongings, clothing, toiletries, misc items, prescription, medication.  Patient stated she [facility] all [facility].

## 2013-09-26 NOTE — [facility] Suicide Risk Assessment (Signed)
[facility] INPATIENT:  Family/Significant Other Suicide Prevention Education  Suicide Prevention Education:  Education Completed; Tara Villanueva, Tara Bay ParkGrandmother, [phone];  has been identified by the patient as the family member/significant other with whom the patient will be residing, and identified as the person(s) who will aid the patient in the event of a mental health crisis (suicidal ideations/suicide attempt).  With written consent from the patient, the family member/significant other has been provided the following suicide prevention education, prior to the and/or following the discharge of the patient.  The suicide prevention education provided includes the following:  Suicide risk factors  Suicide prevention and interventions  National Suicide Hotline telephone number  [facility] assessment telephone number  [facility] Emergency Assistance 911  [facility] and/or Residential Mobile Crisis Unit telephone number  Request made of family/significant other to:  Remove weapons (e.g., guns, rifles, knives), all items previously/currently identified as safety concern.  Grandmother advised patient does not have access to weapons.   Remove drugs/medications (over-the-counter, prescriptions, illicit drugs), all items previously/currently identified as a safety concern.  The family member/significant other verbalizes understanding of the suicide prevention education information provided.  The family member/significant other agrees to remove the items of safety concern listed above.  Tara Villanueva, Tara Villanueva 09/26/2013, 11:42 AM

## 2013-09-26 NOTE — Progress Notes (Signed)
Patient has been up and active on the unit, attended group this evening and has voiced no complaints. She is hopeful to discharge on Monday. Patient currently denies having pain, -si/hi/a/v hall. Support and encouragement offered, safety maintained on unit, will continue to monitor.
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
## 2013-09-26 NOTE — Progress Notes (Signed)
[facility] :  Will you be returning to the same living situation after discharge: Yes,  Patient is returning to her home. At discharge, do you have transportation home?:Yes,  Grandmother will transport patient home. Do you have the ability to pay for your medications:No.  Patient needs assistance with indigent medications   Release of information consent forms completed and in the chart;  Patient's signature needed at discharge.  Patient to Follow up at: Follow-up Information   [facility] (Please go [facility] walk in clinic on Tuesday, September 27, 2013 or any weekday between 8AM - 12 Noon or 1PM- 3PM for medicaition managment/counseling)    Contact information:   [address] [place], [place]   [id number]  [phone]      Patient denies SI/HI: Patient no longer endorsing SI/HI or other thoughts of self harm.  Safety Planning and Suicide Prevention discussed: .Reviewed with all patients during discharge planning group   Raquell Richer, Joesph JulyQuylle Hairston 09/26/2013, 11:44 AM

## 2013-09-29 NOTE — Progress Notes (Signed)
Patient Discharge Instructions:  After Visit Summary (AVS):   Faxed to:  09/29/13 Psychiatric Admission Assessment Note:   Faxed to:  09/29/13 Suicide Risk Assessment - Discharge Assessment:   Faxed to:  09/29/13 Faxed/Sent to the Next Level Care provider:  09/29/13 [facility] to [facility] @ [phone]  Jerelene ReddenSheena E , 09/29/2013, 3:21 PM
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
## 2014-01-16 ENCOUNTER — Encounter (HOSPITAL_COMMUNITY): Payer: Self-pay

## 2014-03-27 ENCOUNTER — Emergency Department: Payer: Self-pay | Admitting: Emergency Medicine

## 2014-03-27 LAB — URINALYSIS, COMPLETE
BILIRUBIN, UR: NEGATIVE
Bacteria: NONE SEEN
Blood: NEGATIVE
GLUCOSE, UR: NEGATIVE mg/dL (ref 0–75)
Ketone: NEGATIVE
Leukocyte Esterase: NEGATIVE
NITRITE: NEGATIVE
PROTEIN: NEGATIVE
Ph: 7 (ref 4.5–8.0)
SPECIFIC GRAVITY: 1.005 (ref 1.003–1.030)
Squamous Epithelial: 2
WBC UR: 3 /HPF (ref 0–5)

## 2014-03-27 LAB — CBC WITH DIFFERENTIAL/PLATELET
BASOS ABS: 0.1 10*3/uL (ref 0.0–0.1)
Basophil %: 1.4 %
Eosinophil #: 0 10*3/uL (ref 0.0–0.7)
Eosinophil %: 0.3 %
HCT: 44 % (ref 35.0–47.0)
HGB: 14.4 g/dL (ref 12.0–16.0)
LYMPHS ABS: 1.9 10*3/uL (ref 1.0–3.6)
Lymphocyte %: 24.6 %
MCH: 27.2 pg (ref 26.0–34.0)
MCHC: 32.6 g/dL (ref 32.0–36.0)
MCV: 83 fL (ref 80–100)
Monocyte #: 0.6 x10 3/mm (ref 0.2–0.9)
Monocyte %: 8.3 %
NEUTROS ABS: 5 10*3/uL (ref 1.4–6.5)
Neutrophil %: 65.4 %
PLATELETS: 239 10*3/uL (ref 150–440)
RBC: 5.29 10*6/uL — AB (ref 3.80–5.20)
RDW: 14.4 % (ref 11.5–14.5)
WBC: 7.6 10*3/uL (ref 3.6–11.0)

## 2014-03-27 LAB — BASIC METABOLIC PANEL
Anion Gap: 8 (ref 7–16)
BUN: 11 mg/dL (ref 7–18)
CO2: 23 mmol/L (ref 21–32)
CREATININE: 0.85 mg/dL (ref 0.60–1.30)
Calcium, Total: 9.2 mg/dL (ref 8.5–10.1)
Chloride: 110 mmol/L — ABNORMAL HIGH (ref 98–107)
EGFR (Non-African Amer.): >60
Glucose: 91 mg/dL (ref 65–99)
Osmolality: 280 (ref 275–301)
Potassium: 3.9 mmol/L (ref 3.5–5.1)
SODIUM: 141 mmol/L (ref 136–145)

## 2014-03-27 LAB — TROPONIN I: Troponin-I: <0.02 ng/mL

## 2014-07-09 IMAGING — US US OB COMP LESS 14 WK
1 series · 14 of 17 positions shown · non-contrast
Comparison: None.

CLINICAL DATA: Cramping pelvic pain.

OBSTETRIC <14 WK US AND TRANSVAGINAL OB US
TECHNIQUE: Both transabdominal and transvaginal ultrasound
examinations were performed for complete evaluation of the
gestation as well as the maternal uterus, adnexal regions, and
pelvic cul-de-sac.  Transvaginal technique was performed to assess
early pregnancy.

[Series 1: us ob comp less 14 wks · 14 of 17 slices shown]
[im 1/17]
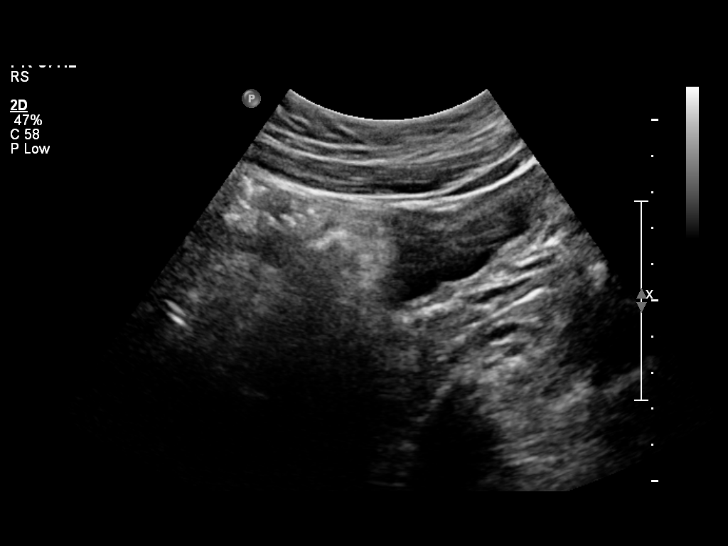
[im 2/17]
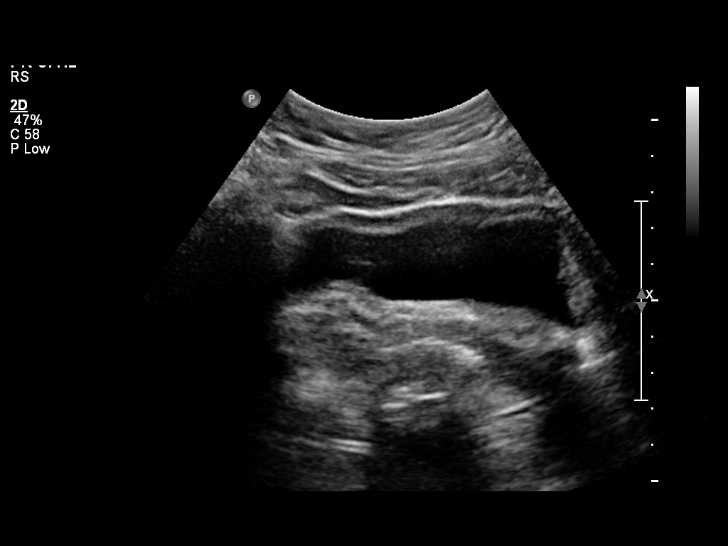
[im 4/17]
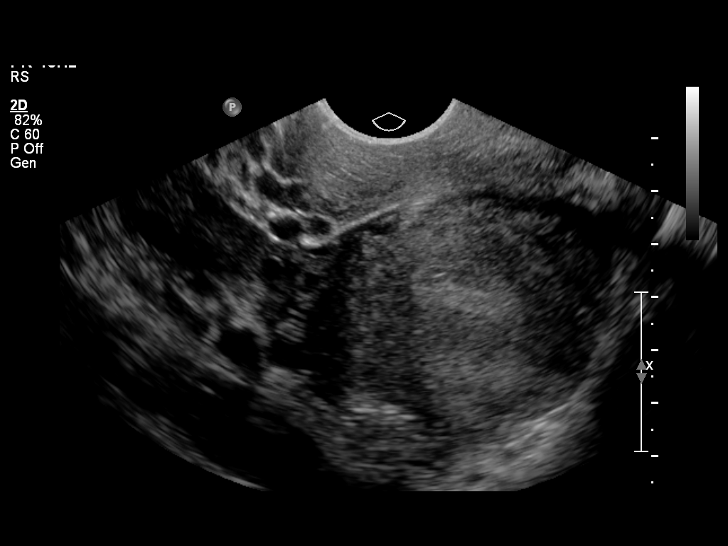
[im 5/17]
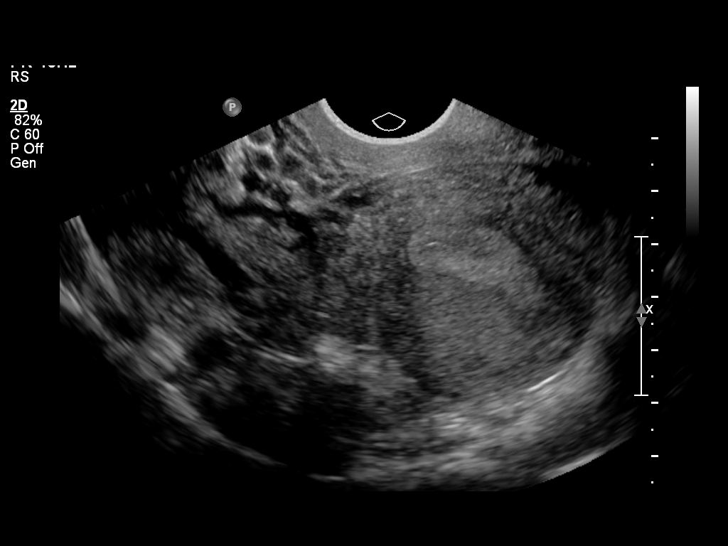
[im 6/17]
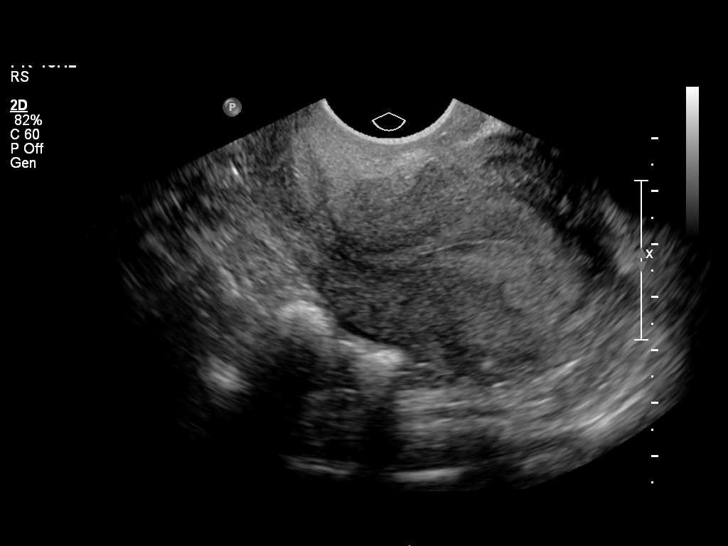
[im 7/17]
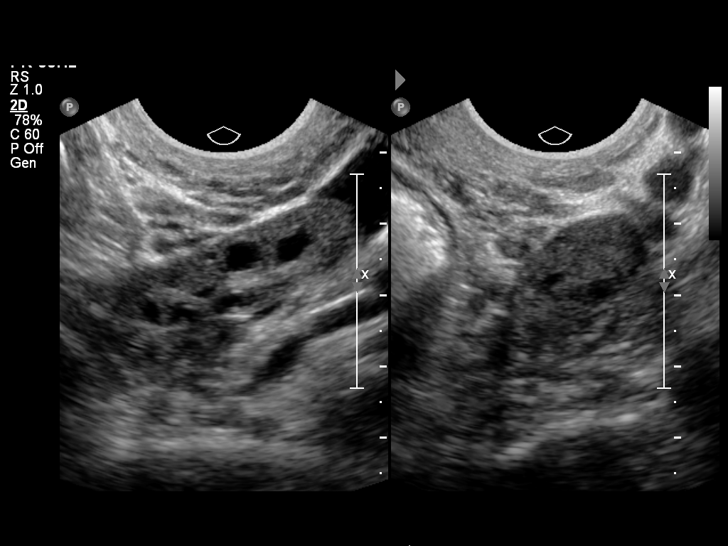
[im 8/17]
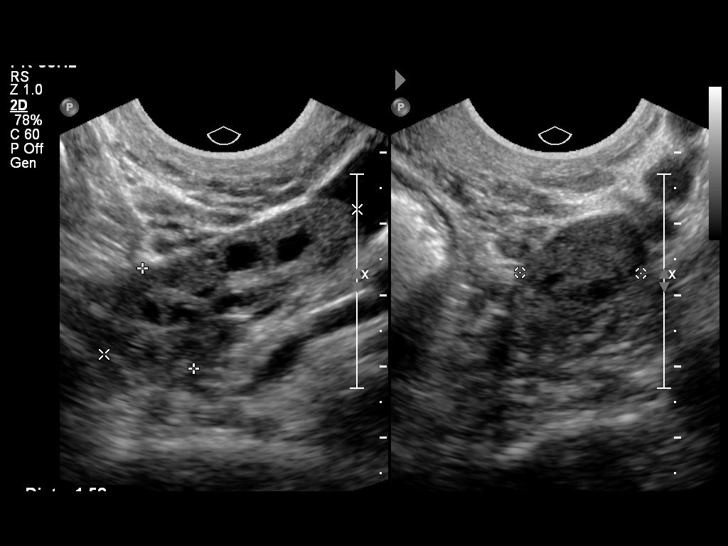
[im 10/17]
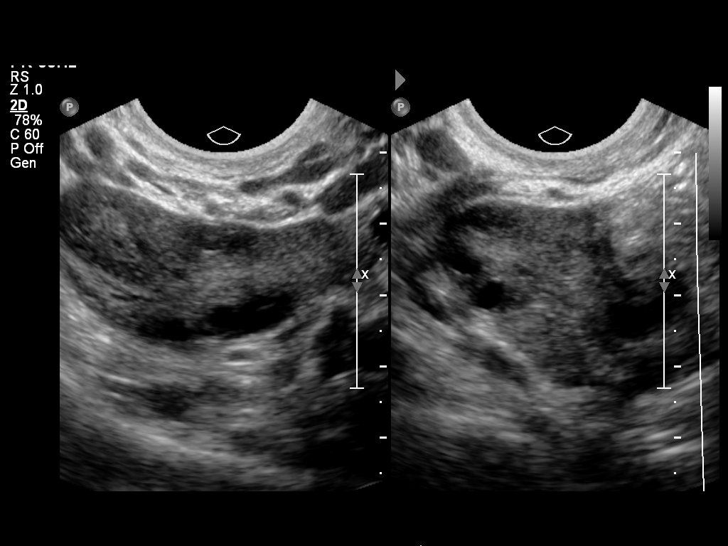
[im 11/17]
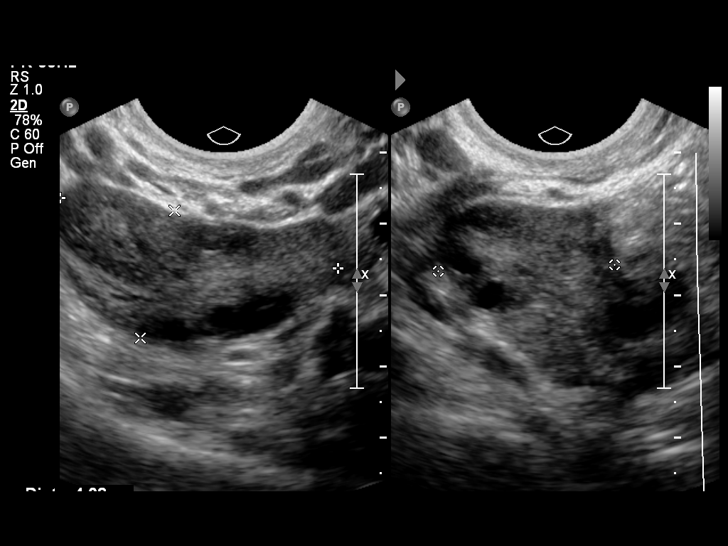
[im 12/17]
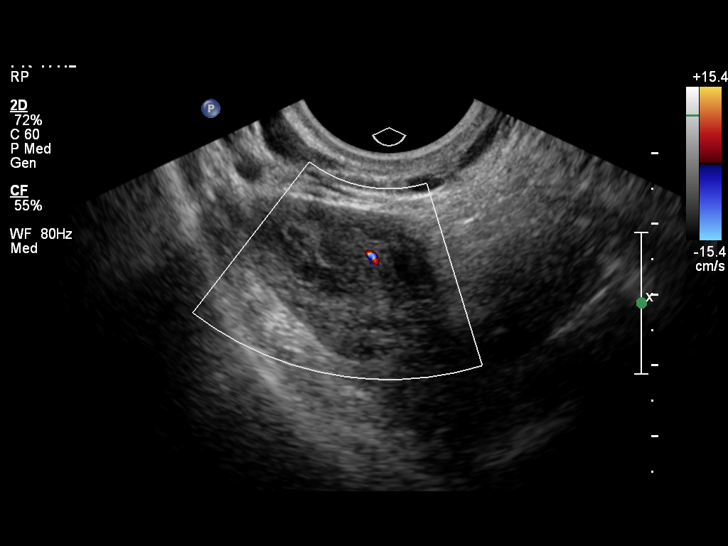
[im 13/17]
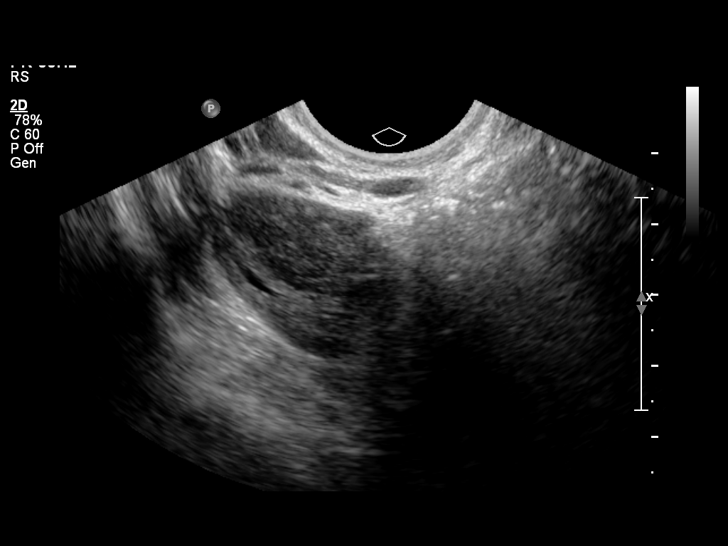
[im 14/17]
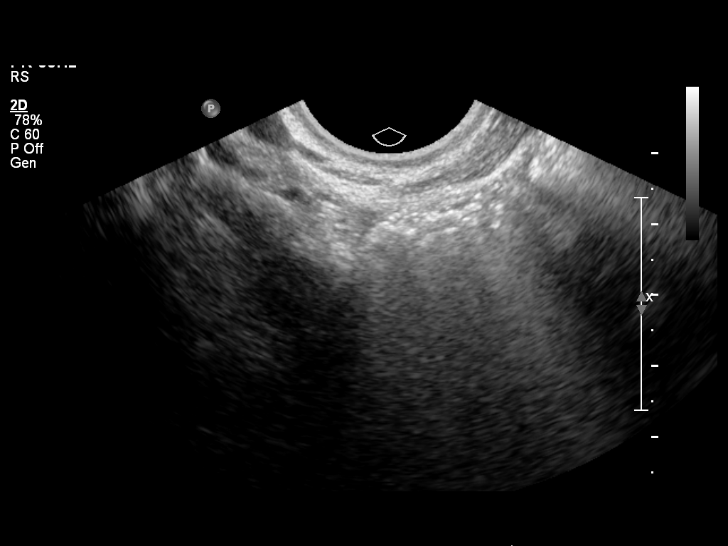
[im 16/17]
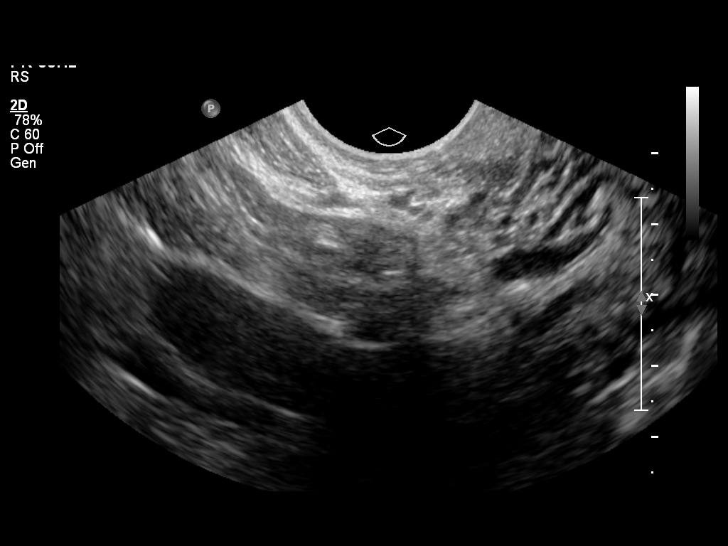
[im 17/17]
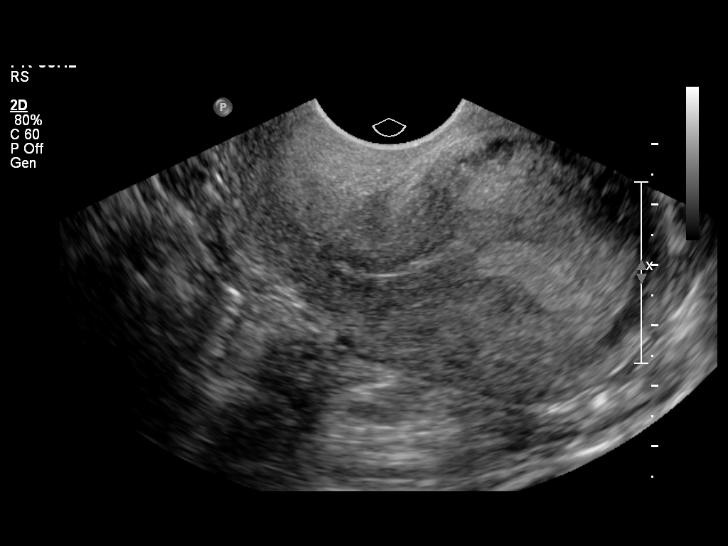

[14 of 17 positions shown; findings below may reference images not displayed]

Intrauterine gestational sac:  None
Yolk sac: None
Embryo: None
Cardiac Activity: N/A
Heart Rate: N/A bpm

Maternal uterus/adnexae:
Normal right ovary.
Normal left ovary.
Trace amount of left periadnexal fluid.
IMPRESSION: 1.  No intrauterine gestational sac.
2.  Normal ovaries.

## 2017-09-24 DIAGNOSIS — Z91410 Personal history of adult physical and sexual abuse: Secondary | ICD-10-CM | POA: Insufficient documentation

## 2017-09-24 DIAGNOSIS — F32A Depression, unspecified: Secondary | ICD-10-CM | POA: Insufficient documentation

## 2018-04-15 DIAGNOSIS — M2550 Pain in unspecified joint: Secondary | ICD-10-CM | POA: Insufficient documentation

## 2018-05-17 DIAGNOSIS — M329 Systemic lupus erythematosus, unspecified: Secondary | ICD-10-CM | POA: Insufficient documentation

## 2018-05-17 DIAGNOSIS — M351 Other overlap syndromes: Secondary | ICD-10-CM | POA: Insufficient documentation

## 2019-10-23 DIAGNOSIS — R87619 Unspecified abnormal cytological findings in specimens from cervix uteri: Secondary | ICD-10-CM | POA: Insufficient documentation

## 2019-11-16 DIAGNOSIS — R4189 Other symptoms and signs involving cognitive functions and awareness: Secondary | ICD-10-CM | POA: Insufficient documentation

## 2019-11-17 DIAGNOSIS — M7989 Other specified soft tissue disorders: Secondary | ICD-10-CM | POA: Insufficient documentation

## 2019-11-17 DIAGNOSIS — R600 Localized edema: Secondary | ICD-10-CM | POA: Insufficient documentation

## 2019-11-17 DIAGNOSIS — R42 Dizziness and giddiness: Secondary | ICD-10-CM | POA: Insufficient documentation

## 2020-03-17 HISTORY — PX: CERVICAL BIOPSY  W/ LOOP ELECTRODE EXCISION: SUR135

## 2020-06-19 DIAGNOSIS — Z01818 Encounter for other preprocedural examination: Secondary | ICD-10-CM | POA: Insufficient documentation

## 2020-06-19 HISTORY — DX: Encounter for other preprocedural examination: Z01.818

## 2020-07-18 DIAGNOSIS — Z9889 Other specified postprocedural states: Secondary | ICD-10-CM | POA: Insufficient documentation

## 2020-09-25 DIAGNOSIS — D8989 Other specified disorders involving the immune mechanism, not elsewhere classified: Secondary | ICD-10-CM | POA: Insufficient documentation

## 2021-01-20 DIAGNOSIS — N644 Mastodynia: Secondary | ICD-10-CM | POA: Insufficient documentation

## 2021-03-20 ENCOUNTER — Encounter (HOSPITAL_COMMUNITY): Payer: Self-pay | Admitting: Emergency Medicine

## 2021-03-20 ENCOUNTER — Inpatient Hospital Stay (HOSPITAL_COMMUNITY)
Admission: AD | Admit: 2021-03-20 | Discharge: 2021-03-21 | Disposition: A | Discharge status: Home / Self Care | Payer: Self-pay | Attending: Obstetrics and Gynecology | Admitting: Obstetrics and Gynecology

## 2021-03-20 ENCOUNTER — Other Ambulatory Visit: Payer: Self-pay

## 2021-03-20 ENCOUNTER — Inpatient Hospital Stay (HOSPITAL_COMMUNITY): Payer: Self-pay

## 2021-03-20 DIAGNOSIS — Z20822 Contact with and (suspected) exposure to covid-19: Secondary | ICD-10-CM | POA: Insufficient documentation

## 2021-03-20 DIAGNOSIS — O26891 Other specified pregnancy related conditions, first trimester: Secondary | ICD-10-CM | POA: Insufficient documentation

## 2021-03-20 DIAGNOSIS — O3680X0 Pregnancy with inconclusive fetal viability, not applicable or unspecified: Secondary | ICD-10-CM | POA: Insufficient documentation

## 2021-03-20 DIAGNOSIS — R1031 Right lower quadrant pain: Secondary | ICD-10-CM | POA: Insufficient documentation

## 2021-03-20 DIAGNOSIS — Z3A01 Less than 8 weeks gestation of pregnancy: Secondary | ICD-10-CM | POA: Insufficient documentation

## 2021-03-20 DIAGNOSIS — R102 Pelvic and perineal pain: Secondary | ICD-10-CM | POA: Insufficient documentation

## 2021-03-20 LAB — CBC WITH DIFFERENTIAL/PLATELET
Abs Immature Granulocytes: 0.03 10*3/uL (ref 0.00–0.07)
Basophils Absolute: 0.1 10*3/uL (ref 0.0–0.1)
Basophils Relative: 1 %
Eosinophils Absolute: 0.1 10*3/uL (ref 0.0–0.5)
Eosinophils Relative: 1 %
HCT: 44.8 % (ref 36.0–46.0)
Hemoglobin: 14.8 g/dL (ref 12.0–15.0)
Immature Granulocytes: 0 %
Lymphocytes Relative: 28 %
Lymphs Abs: 2.6 10*3/uL (ref 0.7–4.0)
MCH: 30.8 pg (ref 26.0–34.0)
MCHC: 33 g/dL (ref 30.0–36.0)
MCV: 93.1 fL (ref 80.0–100.0)
Monocytes Absolute: 0.6 10*3/uL (ref 0.1–1.0)
Monocytes Relative: 7 %
Neutro Abs: 5.6 10*3/uL (ref 1.7–7.7)
Neutrophils Relative %: 63 %
Platelets: 272 10*3/uL (ref 150–400)
RBC: 4.81 MIL/uL (ref 3.87–5.11)
RDW: 13.2 % (ref 11.5–15.5)
WBC: 9 10*3/uL (ref 4.0–10.5)
nRBC: 0 % (ref 0.0–0.2)

## 2021-03-20 LAB — LIPASE, BLOOD: Lipase: 42 U/L (ref 11–51)

## 2021-03-20 LAB — URINALYSIS, ROUTINE W REFLEX MICROSCOPIC
Bacteria, UA: NONE SEEN
Bilirubin Urine: NEGATIVE
Glucose, UA: NEGATIVE mg/dL
Hgb urine dipstick: NEGATIVE
Ketones, ur: NEGATIVE mg/dL
Nitrite: NEGATIVE
Protein, ur: NEGATIVE mg/dL
Specific Gravity, Urine: 1.014 (ref 1.005–1.030)
pH: 7 (ref 5.0–8.0)

## 2021-03-20 LAB — COMPREHENSIVE METABOLIC PANEL
ALT: 20 U/L (ref 0–44)
AST: 23 U/L (ref 15–41)
Albumin: 4.1 g/dL (ref 3.5–5.0)
Alkaline Phosphatase: 76 U/L (ref 38–126)
Anion gap: 8 (ref 5–15)
BUN: 14 mg/dL (ref 6–20)
CO2: 24 mmol/L (ref 22–32)
Calcium: 9.8 mg/dL (ref 8.9–10.3)
Chloride: 103 mmol/L (ref 98–111)
Creatinine, Ser: 0.9 mg/dL (ref 0.44–1.00)
GFR, Estimated: >60 mL/min (ref >60)
Glucose, Bld: 112 mg/dL — ABNORMAL HIGH (ref 70–99)
Potassium: 3.9 mmol/L (ref 3.5–5.1)
Sodium: 135 mmol/L (ref 135–145)
Total Bilirubin: 0.5 mg/dL (ref 0.3–1.2)
Total Protein: 8 g/dL (ref 6.5–8.1)

## 2021-03-20 LAB — RESP PANEL BY RT-PCR (FLU A&B, COVID) ARPGX2
Influenza A by PCR: NEGATIVE
Influenza B by PCR: NEGATIVE
SARS Coronavirus 2 by RT PCR: NEGATIVE

## 2021-03-20 LAB — WET PREP, GENITAL
Clue Cells Wet Prep HPF POC: NONE SEEN
Sperm: NONE SEEN
Trich, Wet Prep: NONE SEEN
WBC, Wet Prep HPF POC: >=10 — AB (ref <10)
Yeast Wet Prep HPF POC: NONE SEEN

## 2021-03-20 LAB — I-STAT BETA HCG BLOOD, ED (MC, WL, AP ONLY): I-stat hCG, quantitative: 189.6 m[IU]/mL — ABNORMAL HIGH (ref <5)

## 2021-03-20 NOTE — ED Provider Notes (Signed)
Emergency Medicine Provider OB Triage Evaluation Note  Gypsy Balsam is a 31 y.o. female, G3P3003, at Unknown gestation who presents to the emergency department with complaints of pelvic pain x 4 days. She was medically screened by previous provider (please see MSE note). Beta hcg returned elevated at 189.6.   Review of  Systems  Positive: + pelvic pain, URI like symptoms Negative: - vaginal bleeding  Physical Exam  BP 136/82 (BP Location: Right Arm)    Pulse (!) 107    Temp 98.5 F (36.9 C) (Oral)    Resp 18    SpO2 100%  General: Awake, no distress  HEENT: Atraumatic  Resp: Normal effort  Cardiac: Normal rate Abd: Nondistended, nontender  MSK: Moves all extremities without difficulty Neuro: Speech clear  Medical Decision Making  Pt evaluated for pregnancy concern and is stable for transfer to MAU. Pt is in agreement with plan for transfer.  10:10 PM Discussed with MAU APP, Hilda Lias, who accepts patient in transfer.  Clinical Impression  No diagnosis found.     Tanda Rockers, PA-C 03/20/21 2217    Melene Plan, DO 03/20/21 2243

## 2021-03-20 NOTE — ED Provider Triage Note (Signed)
Emergency Medicine Provider Triage Evaluation Note  Tara Villanueva , a 31 y.o. female  was evaluated in triage.  Pt complains of vaginal pain x3-4 days. She also admits to fever, nasal congestion, and body aches for the past few days. Negative at home COVID tests. No urinary symptoms.   Review of Systems  Positive: Vaginal pain Negative: CP  Physical Exam  BP 136/82 (BP Location: Right Arm)    Pulse (!) 107    Temp 98.5 F (36.9 C) (Oral)    Resp 18    SpO2 100%  Gen:   Awake, no distress   Resp:  Normal effort  MSK:   Moves extremities without difficulty  Other:    Medical Decision Making  Medically screening exam initiated at 9:27 PM.  Appropriate orders placed.  Tara Villanueva was informed that the remainder of the evaluation will be completed by another provider, this initial triage assessment does not replace that evaluation, and the importance of remaining in the ED until their evaluation is complete.  Labs COVID/influenza test   Jesusita Oka 03/20/21 2130

## 2021-03-20 NOTE — ED Triage Notes (Signed)
Pt here for pelvic and groin pain and itching that started 3-4 days ago. Pt states she had a LEEP in April and has had intermittent abnormal discharge/pain/irregular periods since. Pt denies vaginal bleeding, & discharge. Pt also says 3-4 days ago she started having  some congestion w/ fevers and  body aches. Took 2 covid tests, both negative. Hx lupus
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
## 2021-03-20 NOTE — MAU Note (Addendum)
..  Tara Villanueva Tara Villanueva Tara Villanueva is a 31 y.o. at Unknown here in MAU reporting: Sharp left lower abdominal pain that radiates to her vagina and then radiates to the right side. Reports some vaginal itching and she started taking Azo for it. Reports she has been taking some allergy medicine and antibiotics for bladder infections.  Denies vaginal bleeding.  LMP: 02/19/2021   Pain score: 0/10 (no pain currently because she is trying to process the positive pregnancy test) Vitals:   03/20/21 2102 03/20/21 2248  BP: 136/82 120/67  Pulse: (!) 107 95  Resp: 18 18  Temp: 98.5 F (36.9 C) 98.6 F (37 C)  SpO2: 100% 100%

## 2021-03-21 DIAGNOSIS — O3680X0 Pregnancy with inconclusive fetal viability, not applicable or unspecified: Secondary | ICD-10-CM

## 2021-03-21 DIAGNOSIS — Z3A01 Less than 8 weeks gestation of pregnancy: Secondary | ICD-10-CM

## 2021-03-21 LAB — GC/CHLAMYDIA PROBE AMP (~~LOC~~) NOT AT ARMC
Chlamydia: NEGATIVE
Comment: NEGATIVE
Comment: NORMAL
Neisseria Gonorrhea: NEGATIVE

## 2021-03-23 ENCOUNTER — Inpatient Hospital Stay (HOSPITAL_COMMUNITY): Payer: Self-pay | Attending: Obstetrics and Gynecology

## 2021-10-08 ENCOUNTER — Other Ambulatory Visit: Payer: Self-pay

## 2021-10-08 ENCOUNTER — Emergency Department (HOSPITAL_BASED_OUTPATIENT_CLINIC_OR_DEPARTMENT_OTHER): Payer: Self-pay

## 2021-10-08 ENCOUNTER — Encounter (HOSPITAL_BASED_OUTPATIENT_CLINIC_OR_DEPARTMENT_OTHER): Payer: Self-pay | Admitting: Obstetrics and Gynecology

## 2021-10-08 ENCOUNTER — Emergency Department (HOSPITAL_BASED_OUTPATIENT_CLINIC_OR_DEPARTMENT_OTHER)
Admission: EM | Admit: 2021-10-08 | Discharge: 2021-10-08 | Disposition: A | Discharge status: Home / Self Care | Payer: Self-pay | Attending: Emergency Medicine | Admitting: Emergency Medicine

## 2021-10-08 DIAGNOSIS — R739 Hyperglycemia, unspecified: Secondary | ICD-10-CM | POA: Insufficient documentation

## 2021-10-08 DIAGNOSIS — R519 Headache, unspecified: Secondary | ICD-10-CM

## 2021-10-08 DIAGNOSIS — R110 Nausea: Secondary | ICD-10-CM | POA: Insufficient documentation

## 2021-10-08 DIAGNOSIS — R109 Unspecified abdominal pain: Secondary | ICD-10-CM

## 2021-10-08 LAB — COMPREHENSIVE METABOLIC PANEL
ALT: 21 U/L (ref 0–44)
AST: 21 U/L (ref 15–41)
Albumin: 4.5 g/dL (ref 3.5–5.0)
Alkaline Phosphatase: 63 U/L (ref 38–126)
Anion gap: 9 (ref 5–15)
BUN: 10 mg/dL (ref 6–20)
CO2: 25 mmol/L (ref 22–32)
Calcium: 9.9 mg/dL (ref 8.9–10.3)
Chloride: 102 mmol/L (ref 98–111)
Creatinine, Ser: 1.15 mg/dL — ABNORMAL HIGH (ref 0.44–1.00)
GFR, Estimated: >60 mL/min (ref >60)
Glucose, Bld: 109 mg/dL — ABNORMAL HIGH (ref 70–99)
Potassium: 3.7 mmol/L (ref 3.5–5.1)
Sodium: 136 mmol/L (ref 135–145)
Total Bilirubin: 0.7 mg/dL (ref 0.3–1.2)
Total Protein: 7.7 g/dL (ref 6.5–8.1)

## 2021-10-08 LAB — URINALYSIS, ROUTINE W REFLEX MICROSCOPIC
Bilirubin Urine: NEGATIVE
Glucose, UA: NEGATIVE mg/dL
Hgb urine dipstick: NEGATIVE
Ketones, ur: NEGATIVE mg/dL
Leukocytes,Ua: NEGATIVE
Nitrite: NEGATIVE
Protein, ur: NEGATIVE mg/dL
Specific Gravity, Urine: 1.015 (ref 1.005–1.030)
pH: 7.5 (ref 5.0–8.0)

## 2021-10-08 LAB — CBC WITH DIFFERENTIAL/PLATELET
Abs Immature Granulocytes: 0.03 10*3/uL (ref 0.00–0.07)
Basophils Absolute: 0.1 10*3/uL (ref 0.0–0.1)
Basophils Relative: 1 %
Eosinophils Absolute: 0.8 10*3/uL — ABNORMAL HIGH (ref 0.0–0.5)
Eosinophils Relative: 9 %
HCT: 45.7 % (ref 36.0–46.0)
Hemoglobin: 15.1 g/dL — ABNORMAL HIGH (ref 12.0–15.0)
Immature Granulocytes: 0 %
Lymphocytes Relative: 23 %
Lymphs Abs: 2 10*3/uL (ref 0.7–4.0)
MCH: 29.9 pg (ref 26.0–34.0)
MCHC: 33 g/dL (ref 30.0–36.0)
MCV: 90.5 fL (ref 80.0–100.0)
Monocytes Absolute: 0.5 10*3/uL (ref 0.1–1.0)
Monocytes Relative: 6 %
Neutro Abs: 5.4 10*3/uL (ref 1.7–7.7)
Neutrophils Relative %: 61 %
Platelets: 268 10*3/uL (ref 150–400)
RBC: 5.05 MIL/uL (ref 3.87–5.11)
RDW: 12.7 % (ref 11.5–15.5)
WBC: 8.8 10*3/uL (ref 4.0–10.5)
nRBC: 0 % (ref 0.0–0.2)

## 2021-10-08 LAB — PREGNANCY, URINE: Preg Test, Ur: NEGATIVE

## 2021-10-08 MED ORDER — METOCLOPRAMIDE HCL 5 MG PO TABS: 5.0000 mg | ORAL_TABLET | Freq: Three times a day (TID) | ORAL | 0 refills | Status: DC | PRN

## 2021-10-08 MED ORDER — DEXAMETHASONE SODIUM PHOSPHATE 10 MG/ML IJ SOLN
10.0000 mg | Freq: Once | INTRAMUSCULAR | Status: AC
  Administered 2021-10-08: 10 mg via INTRAVENOUS
  Filled 2021-10-08: qty 1

## 2021-10-08 MED ORDER — KETOROLAC TROMETHAMINE 15 MG/ML IJ SOLN
15.0000 mg | Freq: Once | INTRAMUSCULAR | Status: AC
  Administered 2021-10-08: 15 mg via INTRAVENOUS
  Filled 2021-10-08: qty 1

## 2021-10-08 MED ORDER — METOCLOPRAMIDE HCL 5 MG/ML IJ SOLN
10.0000 mg | Freq: Once | INTRAMUSCULAR | Status: AC
  Administered 2021-10-08: 10 mg via INTRAVENOUS
  Filled 2021-10-08: qty 2

## 2021-10-08 MED ORDER — LACTATED RINGERS IV BOLUS
1000.0000 mL | Freq: Once | INTRAVENOUS | Status: AC
  Administered 2021-10-08: 1000 mL via INTRAVENOUS

## 2021-10-08 NOTE — ED Provider Notes (Signed)
[facility] EMERGENCY DEPT Provider Note   CSN: [id number] Arrival date & time: 10/08/21  1245     History  Chief Complaint  Patient presents with   Flank Pain    Tara Villanueva is a 31 y.o. female with history of lupus, bladder reflux presents the emergency department for evaluation of left-sided flank pain since yesterday morning as well as a headache.  She reports some nausea without any vomiting.  Denies any dysuria, hematuria, urinary urgency, urinary frequency, abdominal pain, vaginal discharge, vaginal bleeding, blurry vision, chest pain, shortness of breath, fevers, chills, neck pain.  The patient reports that she has had a kidney stone in the past, although the left flank pain feels like her previous bladder reflux.  She reports that she has had headaches off and on and this feels like her typical migraine headache.  No known drug allergies.  Denies any tobacco, or EtOH use.  Marijuana use.   Flank Pain Associated symptoms include headaches. Pertinent negatives include no chest pain, no abdominal pain and no shortness of breath.       Home Medications Prior to Admission medications   Medication Sig Start Date End Date Taking? Authorizing Provider  metoCLOPramide (REGLAN) 5 MG tablet Take 1 tablet (5 mg total) by mouth every 8 (eight) hours as needed (headaches). 10/08/21  Yes Achille Rich, PA-C  acetaminophen (TYLENOL) 325 MG tablet Take 650 mg by mouth every 6 (six) hours as needed for mild pain.    [provider]  clotrimazole (GYNE-LOTRIMIN) 1 % vaginal cream Place 1 Applicatorful vaginally at bedtime. 09/26/13   [place], Everardo All, FNP      Allergies    Cinnamon, Pineapple, and Pork-derived products    Review of Systems   Review of Systems  Constitutional:  Negative for chills, fatigue and fever.  HENT:  Negative for congestion, rhinorrhea and sore throat.   Respiratory:  Negative for shortness of breath.   Cardiovascular:   Negative for chest pain.  Gastrointestinal:  Positive for nausea. Negative for abdominal pain, constipation, diarrhea and vomiting.  Genitourinary:  Positive for flank pain. Negative for dysuria, frequency, urgency, vaginal bleeding and vaginal discharge.  Musculoskeletal:  Negative for back pain and neck pain.  Neurological:  Positive for headaches. Negative for dizziness, syncope and light-headedness.    Physical Exam Updated Vital Signs BP 129/75 (BP Location: Right Arm)   Pulse 79   Temp 98.6 F (37 C) (Oral)   Resp 18   Ht 5\' 2"  (1.575 m)   Wt 77.1 kg   LMP 02/19/2021 Comment: Deop shot, neg preg test  SpO2 98%   Breastfeeding Unknown   BMI 31.09 kg/m  Physical Exam Constitutional:      General: She is not in acute distress.    Appearance: Normal appearance. She is not ill-appearing or toxic-appearing.  HENT:     Head: Normocephalic and atraumatic.     Mouth/Throat:     Mouth: Mucous membranes are dry.     Pharynx: Oropharynx is clear.  Eyes:     General: No scleral icterus. Cardiovascular:     Rate and Rhythm: Normal rate and regular rhythm.     Pulses: Normal pulses.  Pulmonary:     Effort: Pulmonary effort is normal. No respiratory distress.     Breath sounds: Normal breath sounds.  Abdominal:     General: Abdomen is flat. Bowel sounds are normal.     Palpations: Abdomen is soft.     Tenderness: There  is no abdominal tenderness. There is no right CVA tenderness, left CVA tenderness, guarding or rebound.  Musculoskeletal:        General: No deformity.     Cervical back: Normal range of motion. No rigidity or tenderness.     Comments: No overlying skin changes noted to the left flank.  No erythema or increased warmth.  Patient has pain on movement, but no pain to palpation.  No midline or paraspinal tenderness palpation.  Lymphadenopathy:     Cervical: No cervical adenopathy.  Skin:    General: Skin is warm and dry.  Neurological:     General: No focal deficit  present.     Mental Status: She is alert. Mental status is at baseline.     Cranial Nerves: No cranial nerve deficit.     Sensory: No sensory deficit.     Motor: No weakness.     Comments: Cranial nerves II through XII intact.  PERRLA.  EOMI.  Patient is answering questions appropriately with appropriate speech.  No facial asymmetry.  Sensation intact throughout.  No pronator drift.  Strength is equal in patient's upper and lower bilateral extremities.  No pronator drift.      ED Results / Procedures / Treatments   Labs (all labs ordered are listed, but only abnormal results are displayed) Labs Reviewed  CBC WITH DIFFERENTIAL/PLATELET - Abnormal; Notable for the following components:      Result Value   Hemoglobin 15.1 (*)    Eosinophils Absolute 0.8 (*)    All other components within normal limits  COMPREHENSIVE METABOLIC PANEL - Abnormal; Notable for the following components:   Glucose, Bld 109 (*)    Creatinine, Ser 1.15 (*)    All other components within normal limits  URINALYSIS, ROUTINE W REFLEX MICROSCOPIC  PREGNANCY, URINE    EKG None  Radiology CT Renal Stone Study  Result Date: 10/08/2021 CLINICAL DATA:  Left flank pain.  Kidney stone suspected. EXAM: CT ABDOMEN AND PELVIS WITHOUT CONTRAST TECHNIQUE: Multidetector CT imaging of the abdomen and pelvis was performed following the standard protocol without IV contrast. RADIATION DOSE REDUCTION: This exam was performed according to the departmental dose-optimization program which includes automated exposure control, adjustment of the mA and/or kV according to patient size and/or use of iterative reconstruction technique. COMPARISON:  CT abdomen and pelvis 11/28/2011 FINDINGS: Lower chest: Lung bases are clear. Hepatobiliary: Normal appearance of the liver and gallbladder. Pancreas: Unremarkable. No pancreatic ductal dilatation or surrounding inflammatory changes. Spleen: Normal in size without focal abnormality. Adrenals/Urinary  Tract: Normal adrenal glands. Normal appearance of both kidneys without hydronephrosis or stones. There is a small peripherally calcified structure in the left hemipelvis that measures 5 mm on sequence 2, image 72. Based on the morphology, this likely represents a phlebolith rather than a ureter stone. In addition, there is no evidence for proximal left ureter dilatation. Normal appearance of the urinary bladder. Stomach/Bowel: Stomach is mildly distended with large amount of stomach contents. No evidence for bowel dilatation. No evidence for focal bowel inflammation. Vascular/Lymphatic: No significant vascular findings are present. No enlarged abdominal or pelvic lymph nodes. Reproductive: Uterus and bilateral adnexa are unremarkable. Other: Negative for free fluid. Negative for free air. Small umbilical hernia containing fat. Musculoskeletal: No acute bone abnormality. IMPRESSION: 1. No evidence for kidney stones or hydronephrosis. Small calcification in left hemipelvis likely represents a phlebolith rather than a urinary stone as described. 2. No acute abnormality in the abdomen or pelvis. Electronically Signed  By: Richarda Overlie M.D.   On: 10/08/2021 17:08    Procedures Procedures   Medications Ordered in ED Medications  ketorolac (TORADOL) 15 MG/ML injection 15 mg (15 mg Intravenous Given 10/08/21 1702)  metoCLOPramide (REGLAN) injection 10 mg (10 mg Intravenous Given 10/08/21 1704)  dexamethasone (DECADRON) injection 10 mg (10 mg Intravenous Given 10/08/21 1655)  lactated ringers bolus 1,000 mL (0 mLs Intravenous Stopped 10/08/21 1839)    ED Course/ Medical Decision Making/ A&P                           Medical Decision Making Amount and/or Complexity of Data Reviewed Labs: ordered. Radiology: ordered.  Risk Prescription drug management.    31 year old female presents the emergency department for evaluation of left flank pain and headache since yesterday.  Differential diagnosis includes  but is not limited to meningitis, migraine headache, bad headache, reflux, UTI, pyelonephritis, hydro nephritis, kidney stone, MSK.  Vital signs are unremarkable.  Physical exam as listed above.  Urinalysis is completely negative.  No nitrates, leukocytes, or ketones.  Normal pH.  Pregnancy test is negative.  CBC shows slightly high hemoglobin of 15.1, normal hematocrit.  Normal platelets.  CMP shows slightly elevated glucose although not fasting.  Creatinine slightly elevated at 1.15 with previous baseline being around 1.  No LFT or electrolyte abnormalities.  Normal gap.  CT renal stone study shows 1. No evidence for kidney stones or hydronephrosis. Small calcification in left hemipelvis likely represents a phlebolith rather than a urinary stone as described. 2. No acute abnormality in the abdomen or pelvis.  Migraine cocktail ordered for the patient including 1 L of LR, Reglan, Decadron, and Toradol.  On reevaluation, the patient reports that she is feeling much better.  My attending assessed at bedside and reports that she is safe for discharge.  He recommended Reglan 5 mg every 8 hours as needed.  I doubt any meningitis given the patient's full range of motion of her neck.  No meningeal symptoms.  She has a benign neurological exam.  CT is overall unremarkable other than the possible phlebolith.  AC.  Unsure what is causing the patient's left flank pain however she does not have any CVA tenderness.  Possibly MSK.  Given the patient's history of possible lupus and her bladder reflux and the fact that she moved here from [place], I did give her a referral for urologist.  Recommended that she look for a rheumatologist that are in her network.  Also gave her follow-up for neurology and for a primary care with [facility].  We discussed return precautions and red flag symptoms.  Patient verbalized understanding and agrees to the plan.  Patient is stable being discharged home in good  condition.  I discussed this case with my attending physician who cosigned this note including patient's presenting symptoms, physical exam, and planned diagnostics and interventions. Attending physician stated agreement with plan or made changes to plan which were implemented.   Attending physician assessed patient at bedside.  Final Clinical Impression(s) / ED Diagnoses Final diagnoses:  Flank pain  Bad headache    Rx / DC Orders ED Discharge Orders          Ordered    metoCLOPramide (REGLAN) 5 MG tablet  Every 8 hours PRN        10/08/21 2046              Achille Rich, PA-C 10/10/21  1114    Tanda Rockers A, DO 10/12/21 1511

## 2021-10-08 NOTE — Discharge Instructions (Addendum)
You were seen in the emergency department for evaluation of your headache and your left flank pain.  Your CT did not show any signs of cause of this.  I am glad you are feeling better after the medication we gave you for your headache.  Because of your history of your bladder reflux, I have included information for a urologist for you to follow-up with as needed.  Additionally, I would recommend following with a rheumatologist in the area as well.  Please make sure you are staying well-hydrated with plenty of fluids.  I am also sending you home on Reglan 5 mg 3 times daily as needed.  I have also included information for follow-up with a neurologist for your headaches as well.  Additionally, I have listed the information for PCP as well.  Please make sure you call these numbers to schedule appointments.  If you have any concern, new or worsening symptoms, please return the nearest emergency department for reevaluation.   Contact a doctor if: Medicine does not help your pain. You have new symptoms. Your pain gets worse. Your symptoms last longer than 2-3 days. You have trouble peeing. You are peeing more often than normal. Get help right away if: You have trouble breathing. You are short of breath. Your belly hurts, or it is swollen or red. You feel like you may vomit (nauseous). You vomit. You feel faint, or you faint. You have blood in your pee. You have flank pain and a fever. These symptoms may be an emergency. Get help right away. Call your local emergency services (911 in the [place] Do not wait to see if the symptoms will go away. Do not drive yourself to the hospital.  Contact a doctor if: Medicine does not help your symptoms. You have a headache that feels different than the other headaches. You feel like you may vomit (nauseous) or you vomit. You have a fever. Get help right away if: Your headache: Gets very bad quickly. Gets worse after a lot of physical activity. You have  any of these symptoms: You continue to vomit. A stiff neck. Trouble seeing. Your eye or ear hurts. Trouble speaking. Weak muscles or you lose muscle control. You lose your balance or have trouble walking. You feel like you will pass out (faint) or you pass out. You are mixed up (confused). You have a seizure. These symptoms may be an emergency. Get help right away. Call your local emergency services (911 in the [place] Do not wait to see if the symptoms will go away. Do not drive yourself to the hospital.

## 2021-10-08 NOTE — ED Triage Notes (Signed)
Patient reports to the ER for left sided flank pain. Patient reports she has a hx of of bladder reflux. Patient also reports she has been having migraines. Patient denies pain with urination.

## 2021-10-08 NOTE — ED Notes (Addendum)
Left sided flank pain ongoing intermittently for the last few days. Hx of bladder reflux. Typically follows up with her primary in [place] but moved down here and hasn't found a new primary.

## 2021-11-05 ENCOUNTER — Encounter (HOSPITAL_BASED_OUTPATIENT_CLINIC_OR_DEPARTMENT_OTHER): Payer: Self-pay | Admitting: Nurse Practitioner

## 2021-11-05 ENCOUNTER — Other Ambulatory Visit (HOSPITAL_BASED_OUTPATIENT_CLINIC_OR_DEPARTMENT_OTHER): Payer: Self-pay

## 2021-11-05 ENCOUNTER — Ambulatory Visit (INDEPENDENT_AMBULATORY_CARE_PROVIDER_SITE_OTHER): Payer: Self-pay | Admitting: Nurse Practitioner

## 2021-11-05 VITALS — BP 113/73 | HR 86 | Ht 62.0 in | Wt 183.0 lb

## 2021-11-05 DIAGNOSIS — Z0000 Encounter for general adult medical examination without abnormal findings: Secondary | ICD-10-CM

## 2021-11-05 DIAGNOSIS — M329 Systemic lupus erythematosus, unspecified: Secondary | ICD-10-CM

## 2021-11-05 DIAGNOSIS — N898 Other specified noninflammatory disorders of vagina: Secondary | ICD-10-CM

## 2021-11-05 DIAGNOSIS — G43009 Migraine without aura, not intractable, without status migrainosus: Secondary | ICD-10-CM

## 2021-11-05 MED ORDER — SUMATRIPTAN SUCCINATE 50 MG PO TABS: ORAL_TABLET | ORAL | 6 refills | Status: DC

## 2021-11-05 MED ORDER — MELOXICAM 15 MG PO TABS: 15.0000 mg | ORAL_TABLET | Freq: Every day | ORAL | 11 refills | Status: DC

## 2021-11-05 MED ORDER — DICLOFENAC SODIUM 1.5 % EX SOLN
CUTANEOUS | 11 refills | Status: DC
  Filled 2021-11-05: qty 150, 14d supply, fill #0

## 2021-11-05 MED ORDER — FLUCONAZOLE 150 MG PO TABS: ORAL_TABLET | ORAL | 2 refills | Status: DC

## 2021-11-05 MED ORDER — DICLOFENAC SODIUM 2 % EX SOLN
2.0000 | Freq: Two times a day (BID) | CUTANEOUS | 6 refills | Status: DC | PRN
  Filled 2021-11-05: qty 112, 14d supply, fill #0

## 2021-11-05 MED ORDER — PREDNISONE 10 MG (48) PO TBPK: ORAL_TABLET | Freq: Every day | ORAL | 2 refills | Status: DC

## 2021-11-05 MED ORDER — PREDNISONE 10 MG PO TABS
ORAL_TABLET | Freq: Every day | ORAL | 2 refills | Status: DC
  Filled 2021-11-05: qty 48, 12d supply, fill #0
  Filled 2022-05-17 (×2): qty 48, 12d supply, fill #1
  Filled 2022-06-17 – 2022-11-04 (×3): qty 48, 12d supply, fill #2

## 2021-11-05 MED ORDER — DICLOFENAC SODIUM 2 % EX SOLN: 2.0000 | Freq: Two times a day (BID) | CUTANEOUS | 6 refills | Status: DC | PRN

## 2021-11-05 MED ORDER — METRONIDAZOLE 500 MG PO TABS: 500.0000 mg | ORAL_TABLET | Freq: Two times a day (BID) | ORAL | 0 refills | Status: DC

## 2021-11-05 MED ORDER — MELOXICAM 15 MG PO TABS
15.0000 mg | ORAL_TABLET | Freq: Every day | ORAL | 11 refills | Status: DC
  Filled 2021-11-05: qty 30, 30d supply, fill #0
  Filled 2021-12-04 – 2022-05-17 (×3): qty 30, 30d supply, fill #1
  Filled 2022-06-17 – 2022-11-04 (×3): qty 30, 30d supply, fill #2

## 2021-11-05 MED ORDER — METRONIDAZOLE 500 MG PO TABS
500.0000 mg | ORAL_TABLET | Freq: Two times a day (BID) | ORAL | 0 refills | Status: DC
  Filled 2021-11-05: qty 14, 7d supply, fill #0

## 2021-11-05 MED ORDER — DICLOFENAC SODIUM 1.5 % EX SOLN: CUTANEOUS | 11 refills | Status: DC

## 2021-11-05 MED ORDER — FLUCONAZOLE 150 MG PO TABS
ORAL_TABLET | ORAL | 2 refills | Status: DC
  Filled 2021-11-05: qty 4, 12d supply, fill #0
  Filled 2021-12-04 – 2022-05-17 (×3): qty 4, 12d supply, fill #1
  Filled 2022-06-17 – 2022-11-04 (×2): qty 4, 12d supply, fill #2

## 2021-11-05 MED ORDER — SUMATRIPTAN SUCCINATE 50 MG PO TABS
ORAL_TABLET | ORAL | 6 refills | Status: DC
  Filled 2021-11-05: qty 9, 4d supply, fill #0
  Filled 2022-05-17 (×2): qty 9, 4d supply, fill #1
  Filled 2022-06-17 – 2022-11-04 (×3): qty 9, 4d supply, fill #2
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
## 2021-11-05 NOTE — Patient Instructions (Addendum)
Thank you for choosing [facility] for your Primary Care needs. I am excited for the opportunity to partner with you to meet your health care goals. It was a pleasure meeting you today!  Recommendations from today's visit: I have sent in the medication to the pharmacy for you. Please let me know if you start to have a flair. I have sent a referral to Rheumatology and we will see if we can get any recommendations from them.    Information on diet, exercise, and health maintenance recommendations are listed below. This is information to help you be sure you are on track for optimal health and monitoring.   Please look over this and let us know if you have any questions or if you have completed any of the health maintenance outside of [facility] so that we can be sure your records are up to date.  ___________________________________________________________ About Me: I am an Adult-Geriatric Nurse Practitioner with a background in caring for patients for more than 20 years with a strong intensive care background. I provide primary care and sports medicine services to patients age 28 and older within this office. My education had a strong focus on caring for the older adult population, which I am passionate about. I am also the director of the APP Fellowship with [facility].   My desire is to provide you with the best service through preventive medicine and supportive care. I consider you a part of the medical team and value your input. I work diligently to ensure that you are heard and your needs are met in a safe and effective manner. I want you to feel comfortable with me as your provider and want you to know that your health concerns are important to me.  For your information, our office hours are: Monday, Tuesday, and Thursday 8:00 AM - 5:00 PM Wednesday and Friday 8:00 AM - 12:00 PM.   In my time away from the office I am teaching new APP's within the system  and am unavailable, but my partner, Dr. Burnard Bunting is in the office for emergent needs.   If you have questions or concerns, please call our office at [phone] or send [place] a MyChart message and we will respond as quickly as possible.  ____________________________________________________________ MyChart:  For all urgent or time sensitive needs we ask that you please call the office to avoid delays. Our number is [phone]. MyChart is not constantly monitored and due to the large volume of messages a day, replies may take up to 72 business hours.  MyChart Policy: MyChart allows for you to see your visit notes, after visit summary, provider recommendations, lab and tests results, make an appointment, request refills, and contact your provider or the office for non-urgent questions or concerns. Providers are seeing patients during normal business hours and do not have built in time to review MyChart messages.  We ask that you allow a minimum of 3 business days for responses to [employer]. For this reason, please do not send urgent requests through [place]. Please call the office at [phone]. New and ongoing conditions may require a visit. We have virtual and in person visit available for your convenience.  Complex MyChart concerns may require a visit. Your provider may request you schedule a virtual or in person visit to ensure we are providing the best care possible. MyChart messages sent after 11:00 AM on Friday will not be received by the provider until Monday morning.  Lab and Test Results: You will receive your lab and test results on MyChart as soon as they are completed and results have been sent by the lab or testing facility. Due to this service, you will receive your results BEFORE your provider.  I review lab and tests results each morning prior to seeing patients. Some results require collaboration with other providers to ensure you are receiving the most appropriate care.  For this reason, we ask that you please allow a minimum of 3-5 business days from the time the ALL results have been received for your provider to receive and review lab and test results and contact you about these.  Most lab and test result comments from the provider will be sent through [place]. Your provider may recommend changes to the plan of care, follow-up visits, repeat testing, ask questions, or request an office visit to discuss these results. You may reply directly to this message or call the office at [phone] to provide information for the provider or set up an appointment. In some instances, you will be called with test results and recommendations. Please let us know if this is preferred and we will make note of this in your chart to provide this for you.    If you have not heard a response to your lab or test results in 5 business days from all results returning to [place], please call the office to let us know. We ask that you please avoid calling prior to this time unless there is an emergent concern. Due to high call volumes, this can delay the resulting process.  After Hours: For all non-emergency after hours needs, please call the office at [phone] and select the option to reach the on-call provider service. On-call services are shared between multiple [facility] offices and therefore it will not be possible to speak directly with your provider. On-call providers may provide medical advice and recommendations, but are unable to provide refills for maintenance medications.  For all emergency or urgent medical needs after normal business hours, we recommend that you seek care at the closest Urgent Care or Emergency Department to ensure appropriate treatment in a timely manner.  [facility] has a 24 hour emergency room located on the ground floor for your convenience.   Urgent Concerns During the Business Day Providers are seeing patients from 8AM to [place]  with a busy schedule and are most often not able to respond to non-urgent calls until the end of the day or the next business day. If you should have URGENT concerns during the day, please call and speak to the nurse or schedule a same day appointment so that we can address your concern without delay.   Thank you, again, for choosing me as your health care partner. I appreciate your trust and look forward to learning more about you.   Tara Keeler, DNP, AGNP-c ___________________________________________________________  Health Maintenance Recommendations Screening Testing Mammogram Every 1 -2 years based on history and risk factors Starting at age 63 Pap Smear Ages 21-39 every 3 years Ages 76-65 every 5 years with HPV testing More frequent testing may be required based on results and history Colon Cancer Screening Every 1-10 years based on test performed, risk factors, and history Starting at age 36 Bone Density Screening Every 2-10 years based on history Starting at age 46 for women Recommendations for men differ based on medication usage, history, and risk factors AAA Screening One time ultrasound Men 69-15 years old who have  every smoked Lung Cancer Screening Low Dose Lung CT every 12 months Age 79-80 years with a 30 pack-year smoking history who still smoke or who have quit within the last 15 years  Screening Labs Routine  Labs: Complete Blood Count (CBC), Complete Metabolic Panel (CMP), Cholesterol (Lipid Panel) Every 6-12 months based on history and medications May be recommended more frequently based on current conditions or previous results Hemoglobin A1c Lab Every 3-12 months based on history and previous results Starting at age 826 or earlier with diagnosis of diabetes, high cholesterol, BMI >26, and/or risk factors Frequent monitoring for patients with diabetes to ensure blood sugar control Thyroid Panel (TSH w/ T3 & T4) Every 6 months based on history, symptoms,  and risk factors May be repeated more often if on medication HIV One time testing for all patients 64 and older May be repeated more frequently for patients with increased risk factors or exposure Hepatitis C One time testing for all patients 34 and older May be repeated more frequently for patients with increased risk factors or exposure Gonorrhea, Chlamydia Every 12 months for all sexually active persons 13-24 years Additional monitoring may be recommended for those who are considered high risk or who have symptoms PSA Men 31-58 years old with risk factors Additional screening may be recommended from age 75-69 based on risk factors, symptoms, and history  Vaccine Recommendations Tetanus Booster All adults every 10 years Flu Vaccine All patients 6 months and older every year COVID Vaccine All patients 12 years and older Initial dosing with booster May recommend additional booster based on age and health history HPV Vaccine 2 doses all patients age 82-26 Dosing may be considered for patients over 26 Shingles Vaccine (Shingrix) 2 doses all adults 21 years and older Pneumonia (Pneumovax 23) All adults 59 years and older May recommend earlier dosing based on health history Pneumonia (Prevnar 49) All adults 5 years and older Dosed 1 year after Pneumovax 23  Additional Screening, Testing, and Vaccinations may be recommended on an individualized basis based on family history, health history, risk factors, and/or exposure.  __________________________________________________________  Diet Recommendations for All Patients  I recommend that all patients maintain a diet low in saturated fats, carbohydrates, and cholesterol. While this can be challenging at first, it is not impossible and small changes can make big differences.  Things to try: Decreasing the amount of soda, sweet tea, and/or juice to one or less per day and replace with water While water is always the first choice, if  you do not like water you may consider adding a water additive without sugar to improve the taste other sugar free drinks Replace potatoes with a brightly colored vegetable at dinner Use healthy oils, such as canola oil or olive oil, instead of butter or hard margarine Limit your bread intake to two pieces or less a day Replace regular pasta with low carb pasta options Bake, broil, or grill foods instead of frying Monitor portion sizes  Eat smaller, more frequent meals throughout the day instead of large meals  An important thing to remember is, if you love foods that are not great for your health, you don't have to give them up completely. Instead, allow these foods to be a reward when you have done well. Allowing yourself to still have special treats every once in a while is a nice way to tell yourself thank you for working hard to keep yourself healthy.   Also remember that every day is a new day.  If you have a bad day and "fall off the wagon", you can still climb right back up and keep moving along on your journey!  We have resources available to help you!  Some websites that may be helpful include: www.http://carter.biz/  Www.VeryWellFit.com _____________________________________________________________  Activity Recommendations for All Patients  I recommend that all adults get at least 20 minutes of moderate physical activity that elevates your heart rate at least 5 days out of the week.  Some examples include: Walking or jogging at a pace that allows you to carry on a conversation Cycling (stationary bike or outdoors) Water aerobics Yoga Weight lifting Dancing If physical limitations prevent you from putting stress on your joints, exercise in a pool or seated in a chair are excellent options.  Do determine your MAXIMUM heart rate for activity: YOUR AGE - 220 = MAX HeartRate   Remember! Do not push yourself too hard.  Start slowly and build up your pace, speed, weight, time in  exercise, etc.  Allow your body to rest between exercise and get good sleep. You will need more water than normal when you are exerting yourself. Do not wait until you are thirsty to drink. Drink with a purpose of getting in at least 8, 8 ounce glasses of water a day plus more depending on how much you exercise and sweat.    If you begin to develop dizziness, chest pain, abdominal pain, jaw pain, shortness of breath, headache, vision changes, lightheadedness, or other concerning symptoms, stop the activity and allow your body to rest. If your symptoms are severe, seek emergency evaluation immediately. If your symptoms are concerning, but not severe, please let us know so that we can recommend further evaluation.

## 2021-11-06 LAB — COMPREHENSIVE METABOLIC PANEL
ALT: 19 IU/L (ref 0–32)
AST: 21 IU/L (ref 0–40)
Albumin/Globulin Ratio: 1.5 (ref 1.2–2.2)
Albumin: 4.6 g/dL (ref 4.0–5.0)
Alkaline Phosphatase: 79 IU/L (ref 44–121)
BUN/Creatinine Ratio: 9 (ref 9–23)
BUN: 8 mg/dL (ref 6–20)
Bilirubin Total: 0.7 mg/dL (ref 0.0–1.2)
CO2: 19 mmol/L — ABNORMAL LOW (ref 20–29)
Calcium: 9.4 mg/dL (ref 8.7–10.2)
Chloride: 107 mmol/L — ABNORMAL HIGH (ref 96–106)
Creatinine, Ser: 0.9 mg/dL (ref 0.57–1.00)
Globulin, Total: 3.1 g/dL (ref 1.5–4.5)
Glucose: 95 mg/dL (ref 70–99)
Potassium: 4.5 mmol/L (ref 3.5–5.2)
Sodium: 142 mmol/L (ref 134–144)
Total Protein: 7.7 g/dL (ref 6.0–8.5)
eGFR: 89 mL/min/{1.73_m2} (ref >59)

## 2021-11-06 LAB — B12 AND FOLATE PANEL
Folate: 17.6 ng/mL (ref >3.0)
Vitamin B-12: 381 pg/mL (ref 232–1245)

## 2021-11-06 LAB — CBC WITH DIFF/PLATELET
Basophils Absolute: 0.1 10*3/uL (ref 0.0–0.2)
Basos: 2 %
EOS (ABSOLUTE): 0 10*3/uL (ref 0.0–0.4)
Eos: 1 %
Hematocrit: 45.3 % (ref 34.0–46.6)
Hemoglobin: 14.7 g/dL (ref 11.1–15.9)
Immature Grans (Abs): 0 10*3/uL (ref 0.0–0.1)
Immature Granulocytes: 0 %
Lymphocytes Absolute: 2.3 10*3/uL (ref 0.7–3.1)
Lymphs: 35 %
MCH: 29.8 pg (ref 26.6–33.0)
MCHC: 32.5 g/dL (ref 31.5–35.7)
MCV: 92 fL (ref 79–97)
Monocytes Absolute: 0.6 10*3/uL (ref 0.1–0.9)
Monocytes: 9 %
Neutrophils Absolute: 3.5 10*3/uL (ref 1.4–7.0)
Neutrophils: 53 %
Platelets: 268 10*3/uL (ref 150–450)
RBC: 4.93 x10E6/uL (ref 3.77–5.28)
RDW: 12.3 % (ref 11.7–15.4)
WBC: 6.5 10*3/uL (ref 3.4–10.8)

## 2021-11-06 LAB — VITAMIN D 25 HYDROXY (VIT D DEFICIENCY, FRACTURES): Vit D, 25-Hydroxy: 23.7 ng/mL — ABNORMAL LOW (ref 30.0–100.0)

## 2021-11-13 ENCOUNTER — Encounter (HOSPITAL_BASED_OUTPATIENT_CLINIC_OR_DEPARTMENT_OTHER): Payer: Self-pay

## 2021-12-04 ENCOUNTER — Other Ambulatory Visit (HOSPITAL_BASED_OUTPATIENT_CLINIC_OR_DEPARTMENT_OTHER): Payer: Self-pay

## 2021-12-10 DIAGNOSIS — M329 Systemic lupus erythematosus, unspecified: Secondary | ICD-10-CM | POA: Insufficient documentation

## 2021-12-10 DIAGNOSIS — N898 Other specified noninflammatory disorders of vagina: Secondary | ICD-10-CM | POA: Insufficient documentation

## 2021-12-10 DIAGNOSIS — G43009 Migraine without aura, not intractable, without status migrainosus: Secondary | ICD-10-CM | POA: Insufficient documentation

## 2021-12-10 DIAGNOSIS — B3731 Acute candidiasis of vulva and vagina: Secondary | ICD-10-CM | POA: Insufficient documentation

## 2021-12-10 NOTE — Assessment & Plan Note (Signed)
Symptoms and presentation consistent with vaginal yeast infection.  We will send in treatment with Diflucan.  If treatment is not effective patient will follow-up for vaginal swab.

## 2021-12-10 NOTE — Assessment & Plan Note (Signed)
Positive ANA with anti-Ro/anti-La antibodies detected prior to moved to [facility].  She has not seen rheumatology for this.  We will send referral today.

## 2021-12-10 NOTE — Assessment & Plan Note (Signed)
>>  ASSESSMENT AND PLAN FOR VAGINAL IRRITATION WRITTEN ON 12/10/2021  8:57 PM BY Jonus Coble E, NP  Symptoms and presentation consistent with vaginal yeast infection.  We will send in treatment with Diflucan.  If treatment is not effective patient will follow-up for vaginal swab.

## 2021-12-10 NOTE — Assessment & Plan Note (Signed)
Chronic.  Refill on Imitrex provided today.  No alarm symptoms present at this time.  Follow-up if symptoms worsen or fail to improve.

## 2021-12-12 ENCOUNTER — Other Ambulatory Visit (HOSPITAL_BASED_OUTPATIENT_CLINIC_OR_DEPARTMENT_OTHER): Payer: Self-pay

## 2022-03-24 ENCOUNTER — Telehealth: Payer: Self-pay | Admitting: Emergency Medicine

## 2022-03-24 DIAGNOSIS — M2550 Pain in unspecified joint: Secondary | ICD-10-CM

## 2022-03-24 NOTE — Patient Instructions (Signed)
Because I cannot adequately assess or treat your condition over video, I feel your condition warrants further evaluation and I recommend that you be seen in a face to face visit.   NOTE: There will be NO CHARGE for this Visit   If you are having a true medical emergency please call 911.      For an urgent face to face visit, [facility] has seven urgent care centers for your convenience:     [facility] at  Get Driving Directions [phone] [address] [place], [place] [address]    [facility]) Get Driving Directions [phone] [place], [place] [address]  [facility] ([place]) Get Driving Directions [phone] [address] [place],  [place]  [address]  [facility] at [place] Get Driving Directions  [phone] [address] [place],  [place] [address]   [facility] Get Driving Directions [phone] [address] [place], [place], [place] [address]   [facility] at [facility] Get Driving Directions  [phone] [address] [place],  [address]   [facility] [place] Get Driving Directions [phone] [address] [place],  [address]
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
## 2022-03-25 ENCOUNTER — Encounter: Payer: Self-pay | Admitting: Internal Medicine

## 2022-05-17 ENCOUNTER — Other Ambulatory Visit (HOSPITAL_BASED_OUTPATIENT_CLINIC_OR_DEPARTMENT_OTHER): Payer: Self-pay | Admitting: Nurse Practitioner

## 2022-05-17 ENCOUNTER — Other Ambulatory Visit (HOSPITAL_BASED_OUTPATIENT_CLINIC_OR_DEPARTMENT_OTHER): Payer: Self-pay

## 2022-05-17 DIAGNOSIS — N898 Other specified noninflammatory disorders of vagina: Secondary | ICD-10-CM

## 2022-05-19 ENCOUNTER — Encounter (HOSPITAL_BASED_OUTPATIENT_CLINIC_OR_DEPARTMENT_OTHER): Payer: Self-pay | Admitting: Pharmacist

## 2022-05-19 ENCOUNTER — Other Ambulatory Visit (HOSPITAL_BASED_OUTPATIENT_CLINIC_OR_DEPARTMENT_OTHER): Payer: Self-pay

## 2022-05-23 ENCOUNTER — Ambulatory Visit: Payer: Self-pay | Admitting: Family Medicine

## 2022-05-23 ENCOUNTER — Encounter: Payer: Self-pay | Admitting: Nurse Practitioner

## 2022-05-23 ENCOUNTER — Ambulatory Visit (INDEPENDENT_AMBULATORY_CARE_PROVIDER_SITE_OTHER): Payer: Self-pay | Admitting: Nurse Practitioner

## 2022-05-23 VITALS — BP 124/80 | HR 100 | Wt 199.2 lb

## 2022-05-23 DIAGNOSIS — R6889 Other general symptoms and signs: Secondary | ICD-10-CM

## 2022-05-23 DIAGNOSIS — R600 Localized edema: Secondary | ICD-10-CM

## 2022-05-23 DIAGNOSIS — M329 Systemic lupus erythematosus, unspecified: Secondary | ICD-10-CM

## 2022-05-23 DIAGNOSIS — M351 Other overlap syndromes: Secondary | ICD-10-CM

## 2022-05-23 DIAGNOSIS — B3731 Acute candidiasis of vulva and vagina: Secondary | ICD-10-CM

## 2022-05-23 DIAGNOSIS — T783XXA Angioneurotic edema, initial encounter: Secondary | ICD-10-CM | POA: Insufficient documentation

## 2022-05-23 DIAGNOSIS — E559 Vitamin D deficiency, unspecified: Secondary | ICD-10-CM

## 2022-05-23 DIAGNOSIS — K649 Unspecified hemorrhoids: Secondary | ICD-10-CM | POA: Insufficient documentation

## 2022-05-23 LAB — LIPID PANEL

## 2022-05-23 NOTE — Progress Notes (Unsigned)
Orma Render, DNP, AGNP-c [facility] [address] [place], [place] [address] [phone]  Subjective:   Tara Villanueva is a 32 y.o. female presents to day for evaluation of:  Tara Villanueva presents today with multiple concerns, including numbness and tingling in her legs from the knees down, which has been ongoing for the past three weeks. She initially noted the symptoms of numbness and swelling following drinking beer several weeks ago. The symptoms resolved spontaneously, but then returned after drinking again. This has never happened in the past. She describes swelling in her feet, particularly on the top part, noting temporary improvement with the use of a compression machine, though the swelling returns upon walking. She has since given up on alcohol, but the swelling continues intermittently. She has also observed her urine being more yellow that usual.   Tara Villanueva reports gaining weight despite adopting healthier eating habits and increasing her water intake. She mentions experiencing weight fluctuations throughout the week of several pounds and a sensation of bloating to the point she feels short of breath.  Furthermore, the patient mentions a lack of energy and feeling weaker, despite making healthier lifestyle choices, such as reducing rice consumption and increasing her intake of fruits and vegetables.   Tara Villanueva reports irregular menstrual cycles with yeast infections before her menstrual period, along with sensitivity and itchiness in the area where the pad touches. Despite switching to organic cotton pads, these symptoms persist. The patient also notes an increase in painful hemorrhoids, a condition present since the birth of her daughter.  The patient has a history of lupus and expresses concerns regarding the potential impact of lupus on pregnancy. She would like to plan for another baby in the near future. She is currently managing her lupus symptoms  through the use of natural remedies and staying active, with her last flare-up occurring two weeks ago.  PMH, Medications, and Allergies reviewed and updated in chart as appropriate.   ROS negative except for what is listed in HPI. Objective:  BP 124/80   Villanueva 100   Wt 199 lb 3.2 oz (90.4 kg)   BMI 36.43 kg/m  Physical Exam Vitals and nursing note reviewed.  Constitutional:      Appearance: Normal appearance. She is obese. She is not ill-appearing.  HENT:     Head: Normocephalic and atraumatic.  Eyes:     Extraocular Movements: Extraocular movements intact.     Pupils: Pupils are equal, round, and reactive to light.  Cardiovascular:     Rate and Rhythm: Normal rate and regular rhythm.     Pulses: Normal pulses.     Heart sounds: Normal heart sounds.  Pulmonary:     Effort: Pulmonary effort is normal.     Breath sounds: Normal breath sounds. No wheezing or rhonchi.  Abdominal:     General: Bowel sounds are normal. There is no distension.     Palpations: Abdomen is soft.     Tenderness: There is no abdominal tenderness. There is no right CVA tenderness, left CVA tenderness or guarding.  Musculoskeletal:        General: Normal range of motion.     Cervical back: Normal range of motion.     Right lower leg: Edema present.     Left lower leg: Edema present.  Lymphadenopathy:     Cervical: No cervical adenopathy.  Skin:    General: Skin is warm and dry.     Capillary Refill: Capillary refill takes less than 2 seconds.  Findings: Rash present.       Neurological:     General: No focal deficit present.     Mental Status: She is alert and oriented to person, place, and time.     Sensory: No sensory deficit.     Motor: No weakness.     Gait: Gait normal.  Psychiatric:        Mood and Affect: Mood normal.         Assessment & Plan:   Problem List Items Addressed This Visit     Systemic lupus erythematosus ([place])    The patient with lupus expresses concerns about  pregnancy and its impact on health and the baby. Reports managing lupus flare-ups naturally at this time. Planning pregnancy in the near future.  Plan: - Provide printed information on lupus and pregnancy for patient education. - Recommend taking a prenatal multivitamin. - Schedule labs to check kidneys, thyroid, inflammatory markers, heart markers, and iron levels. - Advise consultation with an OBGYN regarding medication safety during pregnancy.      Relevant Orders   CBC with Differential/Platelet (Completed)   Sedimentation Rate (Completed)   C-reactive protein (Completed)   Comprehensive metabolic panel (Completed)   Brain natriuretic peptide (Completed)   Vitamin B12 (Completed)   TSH (Completed)   VITAMIN D 25 Hydroxy (Vit-D Deficiency, Fractures) (Completed)   T4, free (Completed)   Iron, TIBC and Ferritin Panel (Completed)   Lipid panel (Completed)   Hemoglobin A1c (Completed)   Bilateral lower extremity edema - Primary    Bilateral edema with paresthesias present to the lower extremities with repeated exacerbations. Symptoms are partially alleviated by using a compression machine, but swelling recurs upon walking. With onset of symptoms after consumption of alcohol, with repeated occurrences, there is concern for possible alcohol allergy. Also consider possible lupus flair as causative factor.  Plan: - Continue the use of the compression machine as needed. - Monitor symptoms and instruct the patient to report any worsening or new symptoms. - Will monitor labs today for signs of inflammatory markers.  - Consider allergist referral.       Relevant Orders   CBC with Differential/Platelet (Completed)   Sedimentation Rate (Completed)   C-reactive protein (Completed)   Comprehensive metabolic panel (Completed)   Brain natriuretic peptide (Completed)   Vitamin B12 (Completed)   TSH (Completed)   VITAMIN D 25 Hydroxy (Vit-D Deficiency, Fractures) (Completed)   T4, free  (Completed)   Iron, TIBC and Ferritin Panel (Completed)   Lipid panel (Completed)   Hemoglobin A1c (Completed)   Vaginal yeast infection    The patient reports recurrent yeast infections before periods and allergic reactions to sanitary pads. Plan: - Prescribe diflucan for yeast infection management. - Consider consuming increased yogurt around menses to help with vaginal flora. - Also consider possible hormone shift causing bacterial vaginosis. We can always test with a swab next time this present.       Relevant Orders   CBC with Differential/Platelet (Completed)   Sedimentation Rate (Completed)   C-reactive protein (Completed)   Comprehensive metabolic panel (Completed)   Brain natriuretic peptide (Completed)   Vitamin B12 (Completed)   TSH (Completed)   VITAMIN D 25 Hydroxy (Vit-D Deficiency, Fractures) (Completed)   T4, free (Completed)   Iron, TIBC and Ferritin Panel (Completed)   Lipid panel (Completed)   Hemoglobin A1c (Completed)   Hemorrhoids    The patient experiences painful hemorrhoids, particularly noticeable before periods. Plan: - Advise daily fiber intake and the use  of stool softeners like Colace to manage symptoms.      Fluctuation of weight    Weight changes with bloating, increased water retention, and difficulty losing weight despite significant dietary and activity changes. At this time, no clear etiology present.  Plan: - Monitor labs to determine if underlying factors may be contributing.  -Continue with good hydration, healthy diet, and exercise.       Other Visit Diagnoses     Mixed connective tissue disease ([place])       Relevant Orders   CBC with Differential/Platelet (Completed)   Sedimentation Rate (Completed)   C-reactive protein (Completed)   Comprehensive metabolic panel (Completed)   Brain natriuretic peptide (Completed)   Vitamin B12 (Completed)   TSH (Completed)   VITAMIN D 25 Hydroxy (Vit-D Deficiency, Fractures) (Completed)   T4,  free (Completed)   Iron, TIBC and Ferritin Panel (Completed)   Lipid panel (Completed)   Hemoglobin A1c (Completed)         Orma Render, DNP, AGNP-c 05/26/2022  6:51 PM    History, Medications, Surgery, SDOH, and Family History reviewed and updated as appropriate.

## 2022-05-23 NOTE — Patient Instructions (Addendum)
I want to check labs to make sure your kidneys, liver, and heart are doing ok. We will also check to make sure that your inflammatory markers are ok.   I have printed a bunch of information on lupus and pregnancy for you to look over.   It could be that your body has developed an allergy to alcohol. This is something that can happen and can cause all kinds of weird symptoms. If the other labs are all normal this is something we can look into a little more. For now, I would continue to avoid.   You can use colace and fiber to help reduce the hemorrhoids.

## 2022-05-26 DIAGNOSIS — R6889 Other general symptoms and signs: Secondary | ICD-10-CM | POA: Insufficient documentation

## 2022-05-26 LAB — CBC WITH DIFFERENTIAL/PLATELET
Basophils Absolute: 0.1 10*3/uL (ref 0.0–0.2)
Basos: 2 %
EOS (ABSOLUTE): 0 10*3/uL (ref 0.0–0.4)
Eos: 1 %
Hematocrit: 42.9 % (ref 34.0–46.6)
Hemoglobin: 14.4 g/dL (ref 11.1–15.9)
Immature Grans (Abs): 0 10*3/uL (ref 0.0–0.1)
Immature Granulocytes: 0 %
Lymphocytes Absolute: 2 10*3/uL (ref 0.7–3.1)
Lymphs: 36 %
MCH: 30 pg (ref 26.6–33.0)
MCHC: 33.6 g/dL (ref 31.5–35.7)
MCV: 89 fL (ref 79–97)
Monocytes Absolute: 0.4 10*3/uL (ref 0.1–0.9)
Monocytes: 7 %
Neutrophils Absolute: 3 10*3/uL (ref 1.4–7.0)
Neutrophils: 54 %
Platelets: 295 10*3/uL (ref 150–450)
RBC: 4.8 x10E6/uL (ref 3.77–5.28)
RDW: 12.4 % (ref 11.7–15.4)
WBC: 5.5 10*3/uL (ref 3.4–10.8)

## 2022-05-26 LAB — TSH: TSH: 0.813 u[IU]/mL (ref 0.450–4.500)

## 2022-05-26 LAB — COMPREHENSIVE METABOLIC PANEL
ALT: 35 IU/L — ABNORMAL HIGH (ref 0–32)
AST: 23 IU/L (ref 0–40)
Albumin/Globulin Ratio: 1.4 (ref 1.2–2.2)
Albumin: 4.5 g/dL (ref 4.0–5.0)
Alkaline Phosphatase: 84 IU/L (ref 44–121)
BUN/Creatinine Ratio: 13 (ref 9–23)
BUN: 10 mg/dL (ref 6–20)
Bilirubin Total: 0.4 mg/dL (ref 0.0–1.2)
CO2: 20 mmol/L (ref 20–29)
Calcium: 9.6 mg/dL (ref 8.7–10.2)
Chloride: 102 mmol/L (ref 96–106)
Creatinine, Ser: 0.8 mg/dL (ref 0.57–1.00)
Globulin, Total: 3.3 g/dL (ref 1.5–4.5)
Glucose: 96 mg/dL (ref 70–99)
Potassium: 4.4 mmol/L (ref 3.5–5.2)
Sodium: 139 mmol/L (ref 134–144)
Total Protein: 7.8 g/dL (ref 6.0–8.5)
eGFR: 102 mL/min/{1.73_m2} (ref >59)

## 2022-05-26 LAB — LIPID PANEL
Chol/HDL Ratio: 3.5 ratio (ref 0.0–4.4)
Cholesterol, Total: 200 mg/dL — ABNORMAL HIGH (ref 100–199)
HDL: 57 mg/dL (ref >39)
LDL Chol Calc (NIH): 122 mg/dL — ABNORMAL HIGH (ref 0–99)
Triglycerides: 118 mg/dL (ref 0–149)
VLDL Cholesterol Cal: 21 mg/dL (ref 5–40)

## 2022-05-26 LAB — VITAMIN B12: Vitamin B-12: 394 pg/mL (ref 232–1245)

## 2022-05-26 LAB — IRON,TIBC AND FERRITIN PANEL
Ferritin: 41 ng/mL (ref 15–150)
Iron Saturation: 18 % (ref 15–55)
Iron: 66 ug/dL (ref 27–159)
Total Iron Binding Capacity: 374 ug/dL (ref 250–450)
UIBC: 308 ug/dL (ref 131–425)

## 2022-05-26 LAB — HEMOGLOBIN A1C
Est. average glucose Bld gHb Est-mCnc: 103 mg/dL
Hgb A1c MFr Bld: 5.2 % (ref 4.8–5.6)

## 2022-05-26 LAB — VITAMIN D 25 HYDROXY (VIT D DEFICIENCY, FRACTURES): Vit D, 25-Hydroxy: 19.3 ng/mL — ABNORMAL LOW (ref 30.0–100.0)

## 2022-05-26 LAB — BRAIN NATRIURETIC PEPTIDE: BNP: <2.5 pg/mL (ref 0.0–100.0)

## 2022-05-26 LAB — C-REACTIVE PROTEIN: CRP: 1 mg/L (ref 0–10)

## 2022-05-26 LAB — T4, FREE: Free T4: 0.97 ng/dL (ref 0.82–1.77)

## 2022-05-26 LAB — SEDIMENTATION RATE: Sed Rate: 16 mm/hr (ref 0–32)

## 2022-05-26 NOTE — Assessment & Plan Note (Signed)
The patient with lupus expresses concerns about pregnancy and its impact on health and the baby. Reports managing lupus flare-ups naturally at this time. Planning pregnancy in the near future.  Plan: - Provide printed information on lupus and pregnancy for patient education. - Recommend taking a prenatal multivitamin. - Schedule labs to check kidneys, thyroid, inflammatory markers, heart markers, and iron levels. - Advise consultation with an OBGYN regarding medication safety during pregnancy.

## 2022-05-26 NOTE — Assessment & Plan Note (Signed)
The patient reports recurrent yeast infections before periods and allergic reactions to sanitary pads. Plan: - Prescribe diflucan for yeast infection management. - Consider consuming increased yogurt around menses to help with vaginal flora. - Also consider possible hormone shift causing bacterial vaginosis. We can always test with a swab next time this present.

## 2022-05-26 NOTE — Assessment & Plan Note (Signed)
Bilateral edema with paresthesias present to the lower extremities with repeated exacerbations. Symptoms are partially alleviated by using a compression machine, but swelling recurs upon walking. With onset of symptoms after consumption of alcohol, with repeated occurrences, there is concern for possible alcohol allergy. Also consider possible lupus flair as causative factor.  Plan: - Continue the use of the compression machine as needed. - Monitor symptoms and instruct the patient to report any worsening or new symptoms. - Will monitor labs today for signs of inflammatory markers.  - Consider allergist referral.

## 2022-05-26 NOTE — Assessment & Plan Note (Signed)
Weight changes with bloating, increased water retention, and difficulty losing weight despite significant dietary and activity changes. At this time, no clear etiology present.  Plan: - Monitor labs to determine if underlying factors may be contributing.  -Continue with good hydration, healthy diet, and exercise.

## 2022-05-26 NOTE — Assessment & Plan Note (Signed)
The patient experiences painful hemorrhoids, particularly noticeable before periods. Plan: - Advise daily fiber intake and the use of stool softeners like Colace to manage symptoms.

## 2022-05-28 MED ORDER — VITAMIN D3 1.25 MG (50000 UT) PO TABS: 1.0000 | ORAL_TABLET | ORAL | 1 refills | Status: DC

## 2022-05-28 NOTE — Addendum Note (Signed)
Addended by: Tysheena Ginzburg, Clarise Cruz E on: 05/28/2022 07:00 PM   Modules accepted: Orders

## 2022-06-03 ENCOUNTER — Ambulatory Visit: Payer: Self-pay | Admitting: Family Medicine

## 2022-06-17 ENCOUNTER — Other Ambulatory Visit (HOSPITAL_BASED_OUTPATIENT_CLINIC_OR_DEPARTMENT_OTHER): Payer: Self-pay

## 2022-07-02 ENCOUNTER — Other Ambulatory Visit (HOSPITAL_BASED_OUTPATIENT_CLINIC_OR_DEPARTMENT_OTHER): Payer: Self-pay

## 2022-07-16 ENCOUNTER — Encounter: Payer: Self-pay | Admitting: Nurse Practitioner

## 2022-07-21 ENCOUNTER — Encounter: Payer: Self-pay | Admitting: Nurse Practitioner

## 2022-07-21 ENCOUNTER — Other Ambulatory Visit: Payer: Self-pay

## 2022-07-21 DIAGNOSIS — E559 Vitamin D deficiency, unspecified: Secondary | ICD-10-CM

## 2022-07-21 MED ORDER — VITAMIN D3 1.25 MG (50000 UT) PO TABS: 1.0000 | ORAL_TABLET | ORAL | 1 refills | Status: DC

## 2022-07-24 ENCOUNTER — Ambulatory Visit: Payer: Self-pay | Admitting: Nurse Practitioner

## 2022-08-05 ENCOUNTER — Encounter: Payer: Self-pay | Admitting: Nurse Practitioner

## 2022-08-13 ENCOUNTER — Telehealth: Payer: Self-pay | Admitting: Nurse Practitioner

## 2022-08-13 ENCOUNTER — Encounter: Payer: Self-pay | Admitting: Nurse Practitioner

## 2022-08-13 DIAGNOSIS — J0140 Acute pansinusitis, unspecified: Secondary | ICD-10-CM

## 2022-08-13 DIAGNOSIS — R051 Acute cough: Secondary | ICD-10-CM

## 2022-08-13 MED ORDER — BENZONATATE 100 MG PO CAPS
100.0000 mg | ORAL_CAPSULE | Freq: Three times a day (TID) | ORAL | 0 refills | Status: DC | PRN
Start: 2022-08-13 — End: 2022-11-14

## 2022-08-13 MED ORDER — SALINE SPRAY 0.65 % NA SOLN
1.0000 | NASAL | 0 refills | Status: DC | PRN
Start: 2022-08-13 — End: 2022-11-14

## 2022-08-13 MED ORDER — AMOXICILLIN-POT CLAVULANATE 875-125 MG PO TABS
1.0000 | ORAL_TABLET | Freq: Two times a day (BID) | ORAL | 0 refills | Status: AC
Start: 2022-08-13 — End: 2022-08-20
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
## 2022-08-21 ENCOUNTER — Encounter: Payer: Self-pay | Admitting: Nurse Practitioner

## 2022-09-21 ENCOUNTER — Other Ambulatory Visit (HOSPITAL_BASED_OUTPATIENT_CLINIC_OR_DEPARTMENT_OTHER): Payer: Self-pay

## 2022-09-30 ENCOUNTER — Other Ambulatory Visit (HOSPITAL_BASED_OUTPATIENT_CLINIC_OR_DEPARTMENT_OTHER): Payer: Self-pay

## 2022-10-24 ENCOUNTER — Encounter: Payer: Self-pay | Admitting: Nurse Practitioner

## 2022-11-04 ENCOUNTER — Other Ambulatory Visit (HOSPITAL_BASED_OUTPATIENT_CLINIC_OR_DEPARTMENT_OTHER): Payer: Self-pay

## 2022-11-14 ENCOUNTER — Telehealth (INDEPENDENT_AMBULATORY_CARE_PROVIDER_SITE_OTHER): Payer: Self-pay | Admitting: Nurse Practitioner

## 2022-11-14 ENCOUNTER — Encounter: Payer: Self-pay | Admitting: Nurse Practitioner

## 2022-11-14 VITALS — Wt 198.0 lb

## 2022-11-14 DIAGNOSIS — M329 Systemic lupus erythematosus, unspecified: Secondary | ICD-10-CM

## 2022-11-14 DIAGNOSIS — R112 Nausea with vomiting, unspecified: Secondary | ICD-10-CM

## 2022-11-14 DIAGNOSIS — K219 Gastro-esophageal reflux disease without esophagitis: Secondary | ICD-10-CM

## 2022-11-14 MED ORDER — HYDROXYCHLOROQUINE SULFATE 200 MG PO TABS
200.0000 mg | ORAL_TABLET | Freq: Every day | ORAL | 3 refills | Status: AC
Start: 2022-11-14 — End: ?

## 2022-11-14 MED ORDER — FAMOTIDINE 20 MG PO TABS
20.0000 mg | ORAL_TABLET | Freq: Two times a day (BID) | ORAL | 2 refills | Status: AC
Start: 2022-11-14 — End: ?

## 2022-11-14 MED ORDER — VITAMIN B-6 25 MG PO TABS
25.0000 mg | ORAL_TABLET | Freq: Three times a day (TID) | ORAL | 2 refills | Status: DC | PRN
Start: 2022-11-14 — End: 2023-03-09

## 2022-11-14 NOTE — Assessment & Plan Note (Signed)
[redacted] weeks gestation with current lupus flair. She has not been seen by GYN, she may benefit from counseling with [facility] GYN for management given the condition. I do feel that rheumatology input is vital to help with monitoring and management. We discussed medications that may be effective for use today including hydroxychloroquine and sulfasalazine. We also discussed the idea of risk vs benefit from both sides. She expresses she would like to start on hydroxychloroquine. We can consider 200mg  daily and monitor closely given that she is actively having a flair. Will send referral for rheumatology.

## 2022-11-14 NOTE — Progress Notes (Signed)
Virtual Visit Encounter mychart visit.   I connected with  Tara Villanueva on 11/14/22 at  3:00 PM EDT by secure video and audio telemedicine application. I verified that I am speaking with the correct person using two identifiers.   I introduced myself as a Publishing rights manager with the practice. The limitations of evaluation and management by telemedicine discussed with the patient and the availability of in person appointments. The patient expressed verbal understanding and consent to proceed.  Participating parties in this visit include: Myself and patient  The patient is: Patient Location: Home I am: Provider Location: Office/Clinic Subjective:    CC and HPI: Tara Villanueva is a 32 y.o. year old female presenting for new evaluation and treatment of lupus during pregnancy. Patient reports the following:  Tara Villanueva is [redacted] weeks pregnant and seeking advice on management of SLE during her pregnancy. She has her first appt scheduled with GYN on 09/03 in [place], but she has received several calls from that office since making the appointment. She tells me she feels the provider is not comfortable with management of her pregnancy in the setting of SLE and has recommended that she stop all medication she has been taking. She historically has taken a prednisone taper for flairs of her lupus. When first contacted by the GYN she tells me she was in a flair and taking the prednisone, but she was told by GYN to stop the medication due to high risk if cleft palate. Since stopping she has been in an active flair with significant back pain and leg pain. She also reports a pruritic rash to her face. She has not seen rheumatology in [place], but is open to a referral.    Tara Villanueva also reports increased reflux symptoms, nausea, and decreased ability to eat during pregnancy. She would like something for nausea and reflux, if possible.   Past medical history, Surgical history, Family  history not pertinant except as noted below, Social history, Allergies, and medications have been entered into the medical record, reviewed, and corrections made.   Review of Systems:  All review of systems negative except what is listed in the HPI  Objective:    Alert and oriented x 4 Speaking in clear sentences with no shortness of breath. No distress.  Impression and Recommendations:    Problem List Items Addressed This Visit     Systemic lupus erythematosus (HCC) - Primary    [redacted] weeks gestation with current lupus flair. She has not been seen by GYN, she may benefit from counseling with [facility] GYN for management given the condition. I do feel that rheumatology input is vital to help with monitoring and management. We discussed medications that may be effective for use today including hydroxychloroquine and sulfasalazine. We also discussed the idea of risk vs benefit from both sides. She expresses she would like to start on hydroxychloroquine. We can consider 200mg  daily and monitor closely given that she is actively having a flair. Will send referral for rheumatology.       Relevant Medications   hydroxychloroquine (PLAQUENIL) 200 MG tablet   Other Relevant Orders   Ambulatory referral to Rheumatology   Other Visit Diagnoses     Nausea and vomiting, unspecified vomiting type       Relevant Medications   pyridOXINE (VITAMIN B6) 25 MG tablet   famotidine (PEPCID) 20 MG tablet   Gastroesophageal reflux disease, unspecified whether esophagitis present       Relevant Medications   famotidine (PEPCID)  20 MG tablet       orders and follow up as documented in EMR I discussed the assessment and treatment plan with the patient. The patient was provided an opportunity to ask questions and all were answered. The patient agreed with the plan and demonstrated an understanding of the instructions.   The patient was advised to call back or seek an in-person evaluation if the symptoms worsen or  if the condition fails to improve as anticipated.  Follow-Up: in a few weeks  I provided 22 minutes of non-face-to-face interaction with this non face-to-face encounter including intake, same-day documentation, and chart review.   Tollie Eth, NP , DNP, AGNP-c [facility]

## 2022-12-04 ENCOUNTER — Telehealth: Payer: Self-pay | Admitting: Nurse Practitioner

## 2022-12-04 ENCOUNTER — Other Ambulatory Visit (HOSPITAL_BASED_OUTPATIENT_CLINIC_OR_DEPARTMENT_OTHER): Payer: Self-pay | Admitting: Nurse Practitioner

## 2022-12-04 ENCOUNTER — Other Ambulatory Visit (HOSPITAL_BASED_OUTPATIENT_CLINIC_OR_DEPARTMENT_OTHER): Payer: Self-pay

## 2022-12-04 DIAGNOSIS — G43009 Migraine without aura, not intractable, without status migrainosus: Secondary | ICD-10-CM

## 2022-12-04 DIAGNOSIS — N898 Other specified noninflammatory disorders of vagina: Secondary | ICD-10-CM

## 2022-12-04 DIAGNOSIS — M329 Systemic lupus erythematosus, unspecified: Secondary | ICD-10-CM

## 2022-12-04 NOTE — Telephone Encounter (Signed)
Patient is needing refill on these medications   She had stopped taking these because she was Pregnant but she miscarried  She wants these sent to [facility] [address] [facility]   SUMAtriptan Succinate May repeat in 2 hours if headache persists or recurs.  Fluconazole 150 MG Take 1 tablet by mouth every 3 days for a total of 4 doses (12 days)  Meloxicam 15 mg Oral Daily  predniSONE 10 MG Start at first sign of lupus flair, take for 12 days per attached sheet. Medication List

## 2022-12-05 ENCOUNTER — Other Ambulatory Visit: Payer: Self-pay

## 2022-12-05 DIAGNOSIS — G43009 Migraine without aura, not intractable, without status migrainosus: Secondary | ICD-10-CM

## 2022-12-05 DIAGNOSIS — M329 Systemic lupus erythematosus, unspecified: Secondary | ICD-10-CM

## 2022-12-05 DIAGNOSIS — N898 Other specified noninflammatory disorders of vagina: Secondary | ICD-10-CM

## 2022-12-05 MED ORDER — MELOXICAM 15 MG PO TABS: 15.0000 mg | ORAL_TABLET | Freq: Every day | ORAL | 5 refills | Status: DC

## 2022-12-05 MED ORDER — PREDNISONE 10 MG PO TABS
ORAL_TABLET | Freq: Every day | ORAL | 2 refills | Status: DC
Start: 2022-12-05 — End: 2023-03-09

## 2022-12-05 MED ORDER — SUMATRIPTAN SUCCINATE 50 MG PO TABS: ORAL_TABLET | ORAL | 6 refills | Status: DC

## 2022-12-05 MED ORDER — FLUCONAZOLE 150 MG PO TABS
ORAL_TABLET | ORAL | 2 refills | Status: DC
Start: 2022-12-05 — End: 2022-12-09

## 2022-12-08 ENCOUNTER — Encounter: Payer: Self-pay | Admitting: Obstetrics and Gynecology

## 2022-12-09 ENCOUNTER — Other Ambulatory Visit (HOSPITAL_BASED_OUTPATIENT_CLINIC_OR_DEPARTMENT_OTHER): Payer: Self-pay

## 2022-12-09 ENCOUNTER — Other Ambulatory Visit: Payer: Self-pay

## 2022-12-09 DIAGNOSIS — G43009 Migraine without aura, not intractable, without status migrainosus: Secondary | ICD-10-CM

## 2022-12-09 DIAGNOSIS — M329 Systemic lupus erythematosus, unspecified: Secondary | ICD-10-CM

## 2022-12-09 DIAGNOSIS — N898 Other specified noninflammatory disorders of vagina: Secondary | ICD-10-CM

## 2022-12-09 MED ORDER — SUMATRIPTAN SUCCINATE 50 MG PO TABS
ORAL_TABLET | ORAL | 6 refills | Status: DC
  Filled 2022-12-09: qty 20, 30d supply, fill #0
  Filled 2023-01-03: qty 20, 30d supply, fill #1

## 2022-12-09 MED ORDER — MELOXICAM 15 MG PO TABS
15.0000 mg | ORAL_TABLET | Freq: Every day | ORAL | 5 refills | Status: DC
  Filled 2022-12-09: qty 30, 30d supply, fill #0
  Filled 2023-01-03: qty 30, 30d supply, fill #1

## 2022-12-09 MED ORDER — FLUCONAZOLE 150 MG PO TABS
ORAL_TABLET | ORAL | 2 refills | Status: AC
Start: 2022-12-09 — End: ?
  Filled 2022-12-09: qty 4, 12d supply, fill #0

## 2022-12-16 ENCOUNTER — Other Ambulatory Visit: Payer: Self-pay

## 2022-12-16 ENCOUNTER — Ambulatory Visit (INDEPENDENT_AMBULATORY_CARE_PROVIDER_SITE_OTHER): Payer: Self-pay | Admitting: Obstetrics and Gynecology

## 2022-12-16 ENCOUNTER — Ambulatory Visit: Payer: Self-pay | Admitting: Family Medicine

## 2022-12-16 ENCOUNTER — Encounter: Payer: Self-pay | Admitting: Obstetrics and Gynecology

## 2022-12-16 VITALS — BP 115/76 | HR 86 | Ht 63.0 in | Wt 205.8 lb

## 2022-12-16 DIAGNOSIS — Z3201 Encounter for pregnancy test, result positive: Secondary | ICD-10-CM | POA: Insufficient documentation

## 2022-12-16 HISTORY — DX: Encounter for pregnancy test, result positive: Z32.01

## 2022-12-16 LAB — POCT PREGNANCY, URINE: Preg Test, Ur: POSITIVE — AB

## 2022-12-16 NOTE — Progress Notes (Signed)
Here for pregnancy test which was positive. States she had miscarriage about 8 weeks ago in mid August and was seen outside of [facility]. States she did not get follow up bhcg after miscarriage. She states she stopped bleeding early September. States last day or two noticed nausea , breast tenderness and heartburn. She did 5 upt at home last night that were positive. States she noticed pink spotting today when she wiped once. She has Lupus and Migraines. Has had 3 babies and 2 miscarriages. Discussed with Dr. Alysia Penna and advised non stat bhcg today and then once results reviewed will decide if needs another bhcg or ultrasound. Advised patient we will contact her with results and plan of care and to call us if any issues. She voices understanding. Nancy Fetter  Meet with pt and reviewed POC Additional f/u as per test results

## 2022-12-16 NOTE — Patient Instructions (Addendum)
Prenatal Care Providers           [facility] for [facility] @ [facility]  [address] [phone]  Center for [facility] @ [facility]   [address]  [phone]  [facility] @ [facility]       [address] [phone]            [facility] for [facility] @ [facility]     [phone]          [facility] for [facility] @ [facility]   [address] [phone]  [facility] for [facility] @ [facility]  [address] [phone]     Center for [facility] @ [address] [place])  [address]   [phone]     [facility]  Phone: [phone]  [facility]  Phone: [phone]  [facility] Phone: [phone]  [facility] Phone: [phone]  [facility] Phone: [phone]  [facility] Phone: [phone]  [facility]  Phone: [phone] mi

## 2022-12-17 LAB — BETA HCG QUANT (REF LAB): hCG Quant: 439 m[IU]/mL

## 2022-12-18 ENCOUNTER — Inpatient Hospital Stay (HOSPITAL_COMMUNITY)
Admission: EM | Admit: 2022-12-18 | Discharge: 2022-12-18 | Disposition: A | Discharge status: Home / Self Care | Payer: Self-pay | Attending: Family Medicine | Admitting: Family Medicine

## 2022-12-18 ENCOUNTER — Encounter (HOSPITAL_COMMUNITY): Payer: Self-pay

## 2022-12-18 ENCOUNTER — Other Ambulatory Visit: Payer: Self-pay

## 2022-12-18 ENCOUNTER — Telehealth: Payer: Self-pay

## 2022-12-18 ENCOUNTER — Other Ambulatory Visit: Payer: Self-pay | Admitting: Obstetrics and Gynecology

## 2022-12-18 ENCOUNTER — Encounter: Payer: Self-pay | Admitting: Obstetrics and Gynecology

## 2022-12-18 ENCOUNTER — Inpatient Hospital Stay (HOSPITAL_COMMUNITY): Payer: Self-pay

## 2022-12-18 DIAGNOSIS — O99352 Diseases of the nervous system complicating pregnancy, second trimester: Secondary | ICD-10-CM | POA: Insufficient documentation

## 2022-12-18 DIAGNOSIS — Z3A00 Weeks of gestation of pregnancy not specified: Secondary | ICD-10-CM

## 2022-12-18 DIAGNOSIS — Z8759 Personal history of other complications of pregnancy, childbirth and the puerperium: Secondary | ICD-10-CM

## 2022-12-18 DIAGNOSIS — O4690 Antepartum hemorrhage, unspecified, unspecified trimester: Secondary | ICD-10-CM | POA: Insufficient documentation

## 2022-12-18 DIAGNOSIS — O99891 Other specified diseases and conditions complicating pregnancy: Secondary | ICD-10-CM | POA: Insufficient documentation

## 2022-12-18 DIAGNOSIS — O039 Complete or unspecified spontaneous abortion without complication: Secondary | ICD-10-CM | POA: Insufficient documentation

## 2022-12-18 DIAGNOSIS — O3680X0 Pregnancy with inconclusive fetal viability, not applicable or unspecified: Secondary | ICD-10-CM

## 2022-12-18 HISTORY — DX: Systemic lupus erythematosus, unspecified: M32.9

## 2022-12-18 LAB — LIPASE, BLOOD: Lipase: 44 U/L (ref 11–51)

## 2022-12-18 LAB — URINALYSIS, ROUTINE W REFLEX MICROSCOPIC
Bilirubin Urine: NEGATIVE
Glucose, UA: NEGATIVE mg/dL
Hgb urine dipstick: NEGATIVE
Ketones, ur: NEGATIVE mg/dL
Leukocytes,Ua: NEGATIVE
Nitrite: NEGATIVE
Protein, ur: NEGATIVE mg/dL
Specific Gravity, Urine: 1.003 — ABNORMAL LOW (ref 1.005–1.030)
pH: 6 (ref 5.0–8.0)

## 2022-12-18 LAB — COMPREHENSIVE METABOLIC PANEL
ALT: 29 U/L (ref 0–44)
AST: 20 U/L (ref 15–41)
Albumin: 4 g/dL (ref 3.5–5.0)
Alkaline Phosphatase: 59 U/L (ref 38–126)
Anion gap: 10 (ref 5–15)
BUN: 13 mg/dL (ref 6–20)
CO2: 24 mmol/L (ref 22–32)
Calcium: 9.2 mg/dL (ref 8.9–10.3)
Chloride: 102 mmol/L (ref 98–111)
Creatinine, Ser: 0.81 mg/dL (ref 0.44–1.00)
GFR, Estimated: >60 mL/min (ref >60)
Glucose, Bld: 98 mg/dL (ref 70–99)
Potassium: 3.7 mmol/L (ref 3.5–5.1)
Sodium: 136 mmol/L (ref 135–145)
Total Bilirubin: 0.5 mg/dL (ref 0.3–1.2)
Total Protein: 7.8 g/dL (ref 6.5–8.1)

## 2022-12-18 LAB — HIV ANTIBODY (ROUTINE TESTING W REFLEX): HIV Screen 4th Generation wRfx: NONREACTIVE

## 2022-12-18 LAB — CBC
HCT: 40.9 % (ref 36.0–46.0)
Hemoglobin: 13 g/dL (ref 12.0–15.0)
MCH: 28.7 pg (ref 26.0–34.0)
MCHC: 31.8 g/dL (ref 30.0–36.0)
MCV: 90.3 fL (ref 80.0–100.0)
Platelets: 277 10*3/uL (ref 150–400)
RBC: 4.53 MIL/uL (ref 3.87–5.11)
RDW: 12.5 % (ref 11.5–15.5)
WBC: 9.3 10*3/uL (ref 4.0–10.5)
nRBC: 0 % (ref 0.0–0.2)

## 2022-12-18 LAB — WET PREP, GENITAL
Clue Cells Wet Prep HPF POC: NONE SEEN
Sperm: NONE SEEN
Trich, Wet Prep: NONE SEEN
WBC, Wet Prep HPF POC: >=10 — AB (ref <10)
Yeast Wet Prep HPF POC: NONE SEEN

## 2022-12-18 LAB — HCG, QUANTITATIVE, PREGNANCY: hCG, Beta Chain, Quant, S: 443 m[IU]/mL — ABNORMAL HIGH (ref <5)

## 2022-12-18 LAB — HCG, SERUM, QUALITATIVE: Preg, Serum: POSITIVE — AB
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
## 2022-12-18 NOTE — ED Triage Notes (Addendum)
Pt states she had a miscarriage x 1 month ago, was not evaluated after; started having spotting and cramping 3 days ago; pregnancy test positive at home and doctors office, unsure if from previous pregnancy; having continued intermittent spotting and cramping that radiates to lower R back; endorses 1 episode of emesis, denies fevers; hx lupus, feels like she is having a flare up

## 2022-12-18 NOTE — Telephone Encounter (Signed)
Called patient at number listed in chart and verified patient with full name and DOB. Informed patient of results and next steps; patient informed me that she is currently in the hospital ED d/t increased cramping, pain, and vaginal bleeding/spotting.   Upon chart review, hospital has drawn hCG serum. Informed patient that we would continue to review and monitor what the hospital reports and finds during her visit. Patient verified understanding and had no further questions or concerns.   Maureen Ralphs RN on 12/18/22 at [phone]

## 2022-12-18 NOTE — MAU Note (Signed)
Having labs drawn in [place] room. Will triage afterward

## 2022-12-18 NOTE — ED Provider Triage Note (Signed)
Emergency Medicine Provider Triage Evaluation Note  Tara Villanueva , a 32 y.o. female  was evaluated in triage.  Pt complains of lower abdominal and pelvic cramping without bleeding.  She has been pregnant 5 times.  She had a miscarriage about a month and a half ago.  No ultrasounds in the interim.  She has had positive pregnancy tests.  Quant drawn 2 days ago was in the 400s.  She was called and told to come back to the emergency department for testing.  Review of Systems  Positive: Abdominal cramps Negative: Vaginal bleeding  Physical Exam  BP 130/80   Pulse 90   Temp 98.2 F (36.8 C)   Resp 16   SpO2 99%  Gen:   Awake, no distress   Resp:  Normal effort  MSK:   Moves extremities without difficulty  Other:  Eating at bedside no problems  Medical Decision Making  Medically screening exam initiated at 5:59 PM.  Appropriate orders placed.  Tara Villanueva was informed that the remainder of the evaluation will be completed by another provider, this initial triage assessment does not replace that evaluation, and the importance of remaining in the ED until their evaluation is complete.  I discussed patient with MAU provider Victorino Dike, cleared for transfer over to the MAU.   Renne Crigler, PA-C 12/18/22 1801

## 2022-12-18 NOTE — Progress Notes (Signed)
Tara Villanueva CNM discussed discharge plans with pt and will return Sat for repeat BHCG. Pt did not want to wait for her d/c papers and left

## 2022-12-18 NOTE — Telephone Encounter (Signed)
-----   Message from Hermina Staggers sent at 12/17/2022 10:01 AM EDT ----- Please repeat BHCG in 48 hrs, non stat. Thanks Casimiro Needle

## 2022-12-18 NOTE — MAU Note (Signed)
.  Natalia Leatherwood Lonna Cobb Verneda Hollopeter is a 32 y.o. at Unknown here in MAU reporting having a SAB beginning of Sept. On Monday I felt weird -dizzy, sore breasts, nausea. Took upt Monday and was positive. WEnt to OB Tues and positive upt. Did not follow up after SAB so do not know if from SAB or new pregnancy. Some mild cramping. Occ spotting.  LMP: unknown Onset of complaint: Monday Pain score: 2 now but 8 when intense.  Vitals:   12/18/22 1943 12/18/22 1945  BP:  130/78  Pulse: 93   Resp: 17   Temp: 97.9 F (36.6 C)   SpO2: 99%      FHT:n/a Lab orders placed from triage:  u/a

## 2022-12-22 ENCOUNTER — Other Ambulatory Visit: Payer: Self-pay | Admitting: *Deleted

## 2022-12-22 DIAGNOSIS — O039 Complete or unspecified spontaneous abortion without complication: Secondary | ICD-10-CM

## 2022-12-22 LAB — GC/CHLAMYDIA PROBE AMP (~~LOC~~) NOT AT ARMC
Chlamydia: NEGATIVE
Comment: NEGATIVE
Comment: NORMAL
Neisseria Gonorrhea: NEGATIVE

## 2022-12-22 LAB — BETA HCG QUANT (REF LAB): hCG Quant: 218 m[IU]/mL

## 2022-12-23 ENCOUNTER — Telehealth: Payer: Self-pay | Admitting: *Deleted

## 2022-12-23 ENCOUNTER — Telehealth: Payer: Self-pay | Admitting: Family Medicine

## 2022-12-23 NOTE — Telephone Encounter (Signed)
Patient called in regarding her plan of care, as per patient she was told to call us regarding a pill to help her pass the baby. Patient states cramps have been very intense but no bleeding.

## 2022-12-23 NOTE — Telephone Encounter (Signed)
Pt notified of declining BHCG's.  I have spoken with J Rasch,CNM who wanted pt to come in the office for repeat labs on Friday.  Pt was upset that she said every time she goes to the lab for HCG's it is 400.00  She said the original plan was 2 BHCG's then she would get the "pill" ? Cytotec.  J Rasch, CNM has agreed that she should at least come for a followup visit on Friday.  Pt is agreeable to the Friday appt with Rasch.

## 2022-12-24 NOTE — Telephone Encounter (Signed)
Spoke with pt via phone today.  She is concerned that she is no longer bleeding but having moderate uterine cramping.  She is wanting advice as to what she can take for the discomfort.  I have recommended Ibuprofen OTC 600 mg every 6 hrs and heating or lidocaine patches to her lower back.  She is still not happy about having to come into the office if she is not getting something to make her pas the POC.  She is switching her in person visit to a my chart visit due to having to get off work.  She is c/oing about what her medical bills are costing and she has no insurance and paying out of pocket.

## 2022-12-25 ENCOUNTER — Ambulatory Visit: Payer: Self-pay

## 2022-12-26 ENCOUNTER — Telehealth (INDEPENDENT_AMBULATORY_CARE_PROVIDER_SITE_OTHER): Payer: Self-pay | Admitting: Obstetrics and Gynecology

## 2022-12-26 DIAGNOSIS — O039 Complete or unspecified spontaneous abortion without complication: Secondary | ICD-10-CM | POA: Insufficient documentation

## 2022-12-26 DIAGNOSIS — Z3A01 Less than 8 weeks gestation of pregnancy: Secondary | ICD-10-CM

## 2022-12-26 NOTE — Progress Notes (Signed)
Subjective:     I connected with  [name] on 12/26/22 by a video enabled telemedicine application and verified that I am speaking with the correct person using two identifiers.   I discussed the limitations of evaluation and management by telemedicine. The patient expressed understanding and agreed to proceed.   Patient was at home, provider was in the office at [facility].    Natalia Leatherwood Lonna Cobb Shaton Lore is a 32 y.o. female here for a follow up visit after recent miscarriage.  She was seen in MAU on 10/3 for cramping and spotting.  US showed Pregnancy of unknown location. No IUP identified. .  She had vaginal bleeding until 10/4. She has/has not had intercourse since her MAU visit.    Reviewed pt health history today.   Had intense pain recently. She is concerned about the amount of pain she is having.  Very small amount of bleeding. Never had heavy bleeding.    Gynecologic/OB History No LMP recorded (lmp unknown). Patient is pregnant.  The following portions of the patient's history were reviewed and updated as appropriate: allergies, current medications, past family history, past medical history, past social history, past surgical history, and problem list.  Review of Systems Pertinent items are noted in HPI.    Objective:     Assessment:   Spontaneous abortion.  HCG 10/1: 439 HCG 10/3: 443 HCG 10/7: 218  Will follow Quant down until zero. Patient to return for HCG next week.  Would consider [place] if Quant does not drop. O positive blood type   Venia Carbon I, NP 12/26/2022 10:49 AM     Plan:

## 2023-01-03 ENCOUNTER — Other Ambulatory Visit (HOSPITAL_BASED_OUTPATIENT_CLINIC_OR_DEPARTMENT_OTHER): Payer: Self-pay

## 2023-01-03 ENCOUNTER — Other Ambulatory Visit: Payer: Self-pay | Admitting: Nurse Practitioner

## 2023-01-03 DIAGNOSIS — Z3201 Encounter for pregnancy test, result positive: Secondary | ICD-10-CM

## 2023-01-05 ENCOUNTER — Other Ambulatory Visit: Payer: Self-pay

## 2023-01-05 DIAGNOSIS — E559 Vitamin D deficiency, unspecified: Secondary | ICD-10-CM

## 2023-01-05 NOTE — Telephone Encounter (Signed)
Sent pt. A message that she needs to recheck Vit D level.

## 2023-01-06 ENCOUNTER — Other Ambulatory Visit (HOSPITAL_BASED_OUTPATIENT_CLINIC_OR_DEPARTMENT_OTHER): Payer: Self-pay

## 2023-01-06 ENCOUNTER — Ambulatory Visit
Admission: EM | Admit: 2023-01-06 | Discharge: 2023-01-06 | Disposition: A | Discharge status: Home / Self Care | Payer: Self-pay | Attending: Emergency Medicine | Admitting: Emergency Medicine

## 2023-01-06 DIAGNOSIS — M62838 Other muscle spasm: Secondary | ICD-10-CM

## 2023-01-06 MED ORDER — BACLOFEN 10 MG PO TABS
10.0000 mg | ORAL_TABLET | Freq: Three times a day (TID) | ORAL | 0 refills | Status: AC
Start: 2023-01-06 — End: 2023-01-14
  Filled 2023-01-07: qty 21, 7d supply, fill #0

## 2023-01-06 MED ORDER — METHYLPREDNISOLONE 4 MG PO TBPK
ORAL_TABLET | ORAL | 0 refills | Status: DC
Start: 2023-01-06 — End: 2023-03-09
  Filled 2023-01-07: qty 21, 6d supply, fill #0

## 2023-01-06 MED ORDER — KETOROLAC TROMETHAMINE 30 MG/ML IJ SOLN
30.0000 mg | Freq: Once | INTRAMUSCULAR | Status: AC
  Administered 2023-01-06: 30 mg via INTRAMUSCULAR

## 2023-01-06 NOTE — Discharge Instructions (Signed)
The mainstay of therapy for musculoskeletal pain is reduction of inflammation and relaxation of tension which is causing inflammation.  Keep in mind, pain always begets more pain.  To help you stay ahead of your pain and inflammation, I have provided the following regimen for you:   During your visit today, you received an injection of ketorolac, high-dose nonsteroidal anti-inflammatory pain medication that should significantly reduce your pain for the next 6 to 8 hours.   When you pick up your prescription from the pharmacy, please begin taking Tylenol 975 mg 3 times daily (every 8 hours).  Please know that It is safe to take a maximum 3000 mg of Tylenol in a 24-hour period.  Please do not exceed this amount.  Tylenol works best when taken on a scheduled basis.   When you pick up your prescription from the pharmacy, you can begin taking baclofen 10 mg.  This is a highly effective muscle relaxer and antispasmodic which should continue to provide you with relaxation of your tense muscles, allow you to sleep well and to keep your pain under control.  You can continue taking this medication 3 times daily as you need to.  If you find that this medication makes you too sleepy, you can break them in half for your daytime doses and, if needed double them for your nighttime dose.  Do not take more than 30 mg of baclofen in a 24-hour period.   When you pick up your prescription from the pharmacy, please begin taking methylprednisolone.  Please take 1 full row tablets at once with your breakfast meal.  This will continue to keep your inflammation under control until your body can heal.  This is a higher dose than your current daily dose of prednisone.  Please do not take prednisone while you are taking methylprednisolone.  Once you have finished methylprednisolone, you can resume prednisone daily.     Please avoid attempts to stretch or strengthen the affected area until you are feeling completely pain-free.   Attempts to do so will only prolong the healing process.   I also recommend that you remain out of work for the next several days, I provided you with a note to return to work in 3 days.  If you feel that you need this time extended, please follow-up with your primary care provider or return to urgent care for reevaluation so that we can provide you with a note for another 3 days.   Thank you for visiting  Urgent Care today.  We appreciate the opportunity to participate in your care.
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
## 2023-01-06 NOTE — ED Triage Notes (Signed)
Pt c/o acute onset of pain to L side of neck into L shoulder . This occurred this morning, when she turned her neck, while pulling on her pants. States she heard a "liquid-like" sound under her ear, and then the pain started.

## 2023-01-07 ENCOUNTER — Other Ambulatory Visit: Payer: Self-pay

## 2023-01-07 ENCOUNTER — Other Ambulatory Visit (HOSPITAL_BASED_OUTPATIENT_CLINIC_OR_DEPARTMENT_OTHER): Payer: Self-pay

## 2023-01-07 DIAGNOSIS — E559 Vitamin D deficiency, unspecified: Secondary | ICD-10-CM

## 2023-01-08 LAB — VITAMIN D 25 HYDROXY (VIT D DEFICIENCY, FRACTURES): Vit D, 25-Hydroxy: 32.8 ng/mL (ref 30.0–100.0)

## 2023-01-20 ENCOUNTER — Other Ambulatory Visit: Payer: Self-pay | Admitting: Nurse Practitioner

## 2023-01-20 DIAGNOSIS — Z3201 Encounter for pregnancy test, result positive: Secondary | ICD-10-CM

## 2023-01-20 MED ORDER — VITAMIN D3 1.25 MG (50000 UT) PO CAPS: 1.2500 mg | ORAL_CAPSULE | ORAL | 3 refills | Status: DC

## 2023-02-06 ENCOUNTER — Telehealth: Payer: Self-pay | Admitting: Nurse Practitioner

## 2023-02-06 NOTE — Telephone Encounter (Signed)
Tara Villanueva called to ask for your advice/recommendations for her 32 yo daughter She has been having skin rash, red blisters, has seen allergist and derm they are saying no allergies Possible immune issue. She wants her tested for lupus since she has it but ped won't test her and is telling her not enough proof to test her   She wants to know what she can do to get her tested and what tests should she ask to have done

## 2023-02-19 ENCOUNTER — Telehealth: Payer: Self-pay | Admitting: Physician Assistant

## 2023-02-19 DIAGNOSIS — J101 Influenza due to other identified influenza virus with other respiratory manifestations: Secondary | ICD-10-CM

## 2023-02-19 MED ORDER — OSELTAMIVIR PHOSPHATE 75 MG PO CAPS
75.0000 mg | ORAL_CAPSULE | Freq: Two times a day (BID) | ORAL | 0 refills | Status: DC
Start: 2023-02-19 — End: 2023-03-09

## 2023-02-19 NOTE — Progress Notes (Signed)
E visit for Flu like symptoms   We are sorry that you are not feeling well.  Here is how we plan to help! Based on what you have shared with me it looks like you may have a respiratory virus that may be influenza.  Influenza or "the flu" is   an infection caused by a respiratory virus. The flu virus is highly contagious and persons who did not receive their yearly flu vaccination may "catch" the flu from close contact.  We have anti-viral medications to treat the viruses that cause this infection. They are not a "cure" and only shorten the course of the infection. These prescriptions are most effective when they are given within the first 2 days of "flu" symptoms. Antiviral medication are indicated if you have a high risk of complications from the flu. You should  also consider an antiviral medication if you are in close contact with someone who is at risk. These medications can help patients avoid complications from the flu  but have side effects that you should know. Possible side effects from Tamiflu or oseltamivir include nausea, vomiting, diarrhea, dizziness, headaches, eye redness, sleep problems or other respiratory symptoms. You should not take Tamiflu if you have an allergy to oseltamivir or any to the ingredients in Tamiflu.  Based upon your symptoms and potential risk factors I have prescribed Oseltamivir (Tamiflu).  It has been sent to your designated pharmacy.  You will take one 75 mg capsule orally twice a day for the next 5 days.  This is an infection that is most likely caused by a virus. There are no specific treatments other than to help you with the symptoms until the infection runs its course.  We are sorry you are not feeling well.  Here is how we plan to help!  For nasal congestion, you may use an oral decongestants such as Mucinex D or if you have glaucoma or high blood pressure use plain Mucinex.  Saline nasal spray or nasal drops can help and can safely be used as often as  needed for congestion.   If you do not have a history of heart disease, hypertension, diabetes or thyroid disease, prostate/bladder issues or glaucoma, you may also use Sudafed to treat nasal congestion.  It is highly recommended that you consult with a pharmacist or your primary care physician to ensure this medication is safe for you to take.     If you have a cough, you may use cough suppressants such as Delsym and Robitussin.  If you have glaucoma or high blood pressure, you can also use Coricidin HBP.    If you have a sore or scratchy throat, use a saltwater gargle-  to  teaspoon of salt dissolved in a 4-ounce to 8-ounce glass of warm water.  Gargle the solution for approximately 15-30 seconds and then spit.  It is important not to swallow the solution.  You can also use throat lozenges/cough drops and Chloraseptic spray to help with throat pain or discomfort.  Warm or cold liquids can also be helpful in relieving throat pain.  For headache, pain or general discomfort, you can use Ibuprofen or Tylenol as directed.   Some authorities believe that zinc sprays or the use of Echinacea may shorten the course of your symptoms.  ANYONE WHO HAS FLU SYMPTOMS SHOULD: Stay home. The flu is highly contagious and going out or to work exposes others! Be sure to drink plenty of fluids. Water is fine as well as fruit  juices, sodas and electrolyte beverages. You may want to stay away from caffeine or alcohol. If you are nauseated, try taking small sips of liquids. How do you know if you are getting enough fluid? Your urine should be a pale yellow or almost colorless. Get rest. Taking a steamy shower or using a humidifier may help nasal congestion and ease sore throat pain. Using a saline nasal spray works much the same way. Cough drops, hard candies and sore throat lozenges may ease your cough. Line up a caregiver. Have someone check on you regularly.   GET HELP RIGHT AWAY IF: You cannot keep down liquids  or your medications. You become short of breath Your fell like you are going to pass out or loose consciousness. Your symptoms persist after you have completed your treatment plan MAKE SURE YOU  Understand these instructions. Will watch your condition. Will get help right away if you are not doing well or get worse.  Your e-visit answers were reviewed by a board certified advanced clinical practitioner to complete your personal care plan.  Depending on the condition, your plan could have included both over the counter or prescription medications.  If there is a problem please reply  once you have received a response from your provider.  Your safety is important to [place].  If you have drug allergies check your prescription carefully.    You can use MyChart to ask questions about today's visit, request a non-urgent call back, or ask for a work or school excuse for 24 hours related to this e-Visit. If it has been greater than 24 hours you will need to follow up with your provider, or enter a new e-Visit to address those concerns.  You will get an e-mail in the next two days asking about your experience.  I hope that your e-visit has been valuable and will speed your recovery. Thank you for using e-visits.   I have spent 5 minutes in review of e-visit questionnaire, review and updating patient chart, medical decision making and response to patient.   Margaretann Loveless, PA-C

## 2023-02-23 ENCOUNTER — Other Ambulatory Visit: Payer: Self-pay | Admitting: Physician Assistant

## 2023-02-23 DIAGNOSIS — J101 Influenza due to other identified influenza virus with other respiratory manifestations: Secondary | ICD-10-CM

## 2023-02-23 NOTE — Telephone Encounter (Signed)
Pt. Had an E-visit on 02/19/23 asking for a refill on Tamiflu we usually do not refill this

## 2023-02-24 ENCOUNTER — Telehealth: Payer: Self-pay | Admitting: Physician Assistant

## 2023-02-24 DIAGNOSIS — J069 Acute upper respiratory infection, unspecified: Secondary | ICD-10-CM

## 2023-02-24 DIAGNOSIS — R0789 Other chest pain: Secondary | ICD-10-CM

## 2023-02-24 DIAGNOSIS — R0602 Shortness of breath: Secondary | ICD-10-CM

## 2023-02-25 NOTE — Progress Notes (Signed)
Because of the associated chest pressure and lightheadedness, I feel your condition warrants further evaluation (examination, oxygen level assessment) and I recommend that you be seen in a face to face visit.   NOTE: There will be NO CHARGE for this eVisit   If you are having a true medical emergency please call 911.      For an urgent face to face visit, [facility] has eight urgent care centers for your convenience:   NEW!! [facility] at [facility] Get Driving Directions [phone] [address], [address] [place], [address]    [facility] at [facility] Get Driving Directions [phone] [address] [place], [place] [address]   [facility] Health Urgent Care Center [facility]) Get Driving Directions [phone] [address] [place], [place] [address]  [facility] Urgent Care Center [facility]) Get Driving Directions [phone] [address] [place],  [place]  [address]  [facility] Health Urgent Care Center [facility] - at [employer]  [phone] [place],  [place] [address]   [facility] Health Urgent Care at [facility] Get Driving Directions [phone] [place], [place] [address]   [facility] Health Urgent Care at [facility] Get Driving Directions  [phone] [address] [place], [place] [address]   [facility] Health Urgent Care at [facility] Directions [phone] [address] [place], [place] [address]  Your MyChart E-visit questionnaire answers were reviewed by a board certified advanced clinical practitioner to complete your personal care plan based on your specific symptoms.  Thank you for using e-Visits.

## 2023-03-03 ENCOUNTER — Telehealth: Payer: Self-pay | Admitting: Family Medicine
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
## 2023-03-04 ENCOUNTER — Telehealth: Payer: Self-pay | Admitting: Nurse Practitioner

## 2023-03-04 DIAGNOSIS — M545 Low back pain, unspecified: Secondary | ICD-10-CM

## 2023-03-04 DIAGNOSIS — J4521 Mild intermittent asthma with (acute) exacerbation: Secondary | ICD-10-CM

## 2023-03-04 DIAGNOSIS — J40 Bronchitis, not specified as acute or chronic: Secondary | ICD-10-CM

## 2023-03-04 MED ORDER — ALBUTEROL SULFATE (2.5 MG/3ML) 0.083% IN NEBU
2.5000 mg | INHALATION_SOLUTION | Freq: Four times a day (QID) | RESPIRATORY_TRACT | 1 refills | Status: DC | PRN
Start: 2023-03-04 — End: 2024-02-05
  Filled 2023-03-05: qty 150, 13d supply, fill #0

## 2023-03-04 MED ORDER — CYCLOBENZAPRINE HCL 5 MG PO TABS
5.0000 mg | ORAL_TABLET | Freq: Every day | ORAL | 0 refills | Status: DC
Start: 2023-03-04 — End: 2023-03-09
  Filled 2023-03-05: qty 5, 5d supply, fill #0

## 2023-03-04 MED ORDER — AZITHROMYCIN 250 MG PO TABS
ORAL_TABLET | ORAL | 0 refills | Status: DC
Start: 2023-03-04 — End: 2023-03-09
  Filled 2023-03-05: qty 6, 5d supply, fill #0

## 2023-03-04 MED ORDER — BENZONATATE 100 MG PO CAPS
100.0000 mg | ORAL_CAPSULE | Freq: Three times a day (TID) | ORAL | 0 refills | Status: DC | PRN
Start: 2023-03-04 — End: 2023-03-09
  Filled 2023-03-05: qty 30, 10d supply, fill #0

## 2023-03-04 NOTE — Telephone Encounter (Signed)
Called and spoke with patient. She has had 2 miscarriages back to back. She had 2 periods since her miscarriage in October. She has been passing blood clots, having significant cramping in the uterine area. The bleeding has stopped from 12/6-12/14. Usually her periods are 2-3 days and extremely light. She says it feels like a contraction either from front to back or back to front.   She denies any chance of a pregnancy since the October miscarriage and is certain she has had periods.   She reports pain is severe with intercourse, she is with the same partner.   She is not concerned that the pregnancy has not passed. Levels were not followed to 0 and repeat US was not performed as patient cancelled since she says the copay is  high. Reviewed she could potentially have retained POC. Reviewed can either go to MAU or schedule an appointment in the office, which seems to be the best idea since this seems like potential AUB.   She is having a Family Medicine MD today. She will call back if she would like to schedule with OB/GYN.
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
## 2023-03-05 ENCOUNTER — Other Ambulatory Visit (HOSPITAL_BASED_OUTPATIENT_CLINIC_OR_DEPARTMENT_OTHER): Payer: Self-pay

## 2023-03-05 ENCOUNTER — Encounter: Payer: Self-pay | Admitting: Nurse Practitioner

## 2023-03-09 ENCOUNTER — Other Ambulatory Visit: Payer: Self-pay | Admitting: Nurse Practitioner

## 2023-03-09 ENCOUNTER — Ambulatory Visit (INDEPENDENT_AMBULATORY_CARE_PROVIDER_SITE_OTHER): Payer: Self-pay | Admitting: Medical

## 2023-03-09 ENCOUNTER — Other Ambulatory Visit (HOSPITAL_BASED_OUTPATIENT_CLINIC_OR_DEPARTMENT_OTHER): Payer: Self-pay

## 2023-03-09 DIAGNOSIS — M545 Low back pain, unspecified: Secondary | ICD-10-CM

## 2023-03-09 MED ORDER — HYDROCODONE-ACETAMINOPHEN 5-325 MG PO TABS
1.0000 | ORAL_TABLET | Freq: Four times a day (QID) | ORAL | 0 refills | Status: DC | PRN
  Filled 2023-03-09: qty 10, 3d supply, fill #0

## 2023-03-09 MED ORDER — CYCLOBENZAPRINE HCL 5 MG PO TABS
5.0000 mg | ORAL_TABLET | Freq: Two times a day (BID) | ORAL | 0 refills | Status: AC | PRN
  Filled 2023-03-09: qty 20, 10d supply, fill #0
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
## 2023-03-18 NOTE — L&D Delivery Note (Signed)
 Patient complete and pushing. SVD of viable female infant over intact perineum. Nuchal cord none . Infant delivered to mom's abdomen by nursing staff. Delayed cord clamping until placenta delivered. Cord clamped x 2, cut. Spontaneous cry heard. Weight and Apgars pending. Cord blood obtained. Placenta delivered spontaneously and intact.  Vagina inspected.no lacerations noted. EBL: 152cc Anesthesia: none

## 2023-04-09 ENCOUNTER — Ambulatory Visit: Payer: Self-pay | Admitting: Nurse Practitioner

## 2023-04-09 ENCOUNTER — Encounter: Payer: Self-pay | Admitting: Nurse Practitioner

## 2023-04-09 ENCOUNTER — Other Ambulatory Visit (HOSPITAL_BASED_OUTPATIENT_CLINIC_OR_DEPARTMENT_OTHER): Payer: Self-pay

## 2023-04-09 VITALS — BP 124/82 | HR 91 | Wt 205.4 lb

## 2023-04-09 DIAGNOSIS — F430 Acute stress reaction: Secondary | ICD-10-CM

## 2023-04-09 DIAGNOSIS — K582 Mixed irritable bowel syndrome: Secondary | ICD-10-CM

## 2023-04-09 DIAGNOSIS — M329 Systemic lupus erythematosus, unspecified: Secondary | ICD-10-CM

## 2023-04-09 DIAGNOSIS — N96 Recurrent pregnancy loss: Secondary | ICD-10-CM

## 2023-04-09 DIAGNOSIS — E162 Hypoglycemia, unspecified: Secondary | ICD-10-CM

## 2023-04-09 DIAGNOSIS — Z7952 Long term (current) use of systemic steroids: Secondary | ICD-10-CM

## 2023-04-09 DIAGNOSIS — F332 Major depressive disorder, recurrent severe without psychotic features: Secondary | ICD-10-CM

## 2023-04-09 DIAGNOSIS — F410 Panic disorder [episodic paroxysmal anxiety] without agoraphobia: Secondary | ICD-10-CM

## 2023-04-09 DIAGNOSIS — F5101 Primary insomnia: Secondary | ICD-10-CM

## 2023-04-09 MED ORDER — TRAZODONE HCL 50 MG PO TABS
25.0000 mg | ORAL_TABLET | Freq: Every evening | ORAL | 1 refills | Status: DC | PRN
  Filled 2023-04-09: qty 60, 30d supply, fill #0

## 2023-04-09 MED ORDER — ALPRAZOLAM 0.25 MG PO TABS
ORAL_TABLET | ORAL | 0 refills | Status: DC
Start: 2023-04-09 — End: 2023-05-13
  Filled 2023-04-09: qty 10, 10d supply, fill #0

## 2023-04-09 NOTE — Patient Instructions (Addendum)
  Ways to reduce cortisol  Cortisol, often called the "stress hormone," plays a key role in your body's stress response, but chronic high levels can be harmful. Here are some natural ways to help lower cortisol:  Exercise - Regular physical activity, especially moderate-intensity exercise like walking, swimming, or yoga, can help reduce cortisol levels. Avoid excessive high-intensity exercise, as it can sometimes have the opposite effect.  Deep Breathing & Meditation - Mindfulness techniques such as deep breathing, progressive muscle relaxation, or meditation can activate your parasympathetic nervous system, which helps lower cortisol.  Adequate Sleep - A consistent sleep schedule and ensuring you get enough restful sleep (7-9 hours for most adults) is crucial in keeping cortisol levels balanced.  Healthy Diet - A balanced diet rich in whole foods, especially those high in antioxidants (like fruits, vegetables, and nuts), can help reduce cortisol. Omega-3 fatty acids (found in fatty fish, flaxseeds, and walnuts) are also helpful.  Adaptogenic Herbs - Herbs like ashwagandha, rhodiola, and holy basil are thought to help regulate cortisol levels and improve the body's ability to handle stress.  Social Connections & Laughter - Positive social interactions and laughter trigger the release of feel-good hormones like endorphins and oxytocin, which can reduce cortisol.  Stay Hydrated - Dehydration can trigger an increase in cortisol. Drinking enough water throughout the day can help prevent that.  Reduce Caffeine Intake - Excessive caffeine can increase cortisol levels, so reducing your intake or opting for alternatives like herbal teas might help.  Massage and Acupuncture - These practices can stimulate relaxation and help lower cortisol by promoting blood flow and relaxation.

## 2023-04-09 NOTE — Progress Notes (Signed)
Shawna Clamp, DNP, AGNP-c [facility]  [address] [place], [place] [address] [phone]  ESTABLISHED PATIENT- Chronic Health and/or Follow-Up Visit  Blood pressure 124/82, pulse 91, weight 205 lb 6.4 oz (93.2 kg), last menstrual period 03/17/2023, not currently breastfeeding.    Natalia Leatherwood Lonna Cobb Selene Peltzer is a 33 y.o. year old female presenting today for evaluation and management of chronic conditions.   The patient, with a known diagnosis of lupus, presents with multiple concerns. The primary issue is a series of Elisama Thissen miscarriages, which the patient attributes to the lupus. The patient describes the emotional toll of these losses, noting significant mood changes and increased anxiety. The patient also reports physical symptoms following the miscarriages, including severe joint pain and muscle tension, which they describe as more intense than their usual lupus flares.  The patient also reports significant changes in their menstrual cycle following the miscarriages. They note that their periods have become longer, heavier, and more painful, with increased clotting. This is a marked change from their previous menstrual pattern, which was characterized by short, light periods with minimal discomfort.  In addition to these reproductive concerns, the patient reports a range of other symptoms. They describe persistent diarrhea, which is unresponsive to dietary changes or over-the-counter medications. They also report unexplained weight gain, despite maintaining a healthy diet and exercise routine. The patient notes that they have been experiencing increased anxiety, with physical symptoms such as sweaty palms and sudden adrenaline rushes. They also report hair loss and the development of new skin rashes.  The patient has recently discontinued steroid treatment for their lupus, and they attribute many of their current symptoms to this change in their medication regimen.  They report a range of withdrawal symptoms, including muscle and joint pain, fatigue, nausea, and difficulty sleeping. They also note that their anxiety has increased since stopping the steroids.  The patient expresses concern about the potential long-term impacts of these issues, particularly in relation to their reproductive health. They are considering whether to continue trying to conceive, given the emotional and physical toll of their recent miscarriages. They also express concern about the possibility of developing additional autoimmune conditions, such as diabetes or thyroid disease.   All ROS negative with exception of what is listed above.   PHYSICAL EXAM Physical Exam Vitals and nursing note reviewed.  Constitutional:      Appearance: Normal appearance.  HENT:     Head: Normocephalic.  Eyes:     Pupils: Pupils are equal, round, and reactive to light.  Cardiovascular:     Rate and Rhythm: Normal rate and regular rhythm.     Pulses: Normal pulses.     Heart sounds: Normal heart sounds.  Pulmonary:     Effort: Pulmonary effort is normal.     Breath sounds: Normal breath sounds.  Musculoskeletal:        General: Normal range of motion.     Cervical back: Normal range of motion.  Skin:    General: Skin is warm.  Neurological:     General: No focal deficit present.     Mental Status: She is alert and oriented to person, place, and time.  Psychiatric:        Mood and Affect: Mood normal.      PLAN Problem List Items Addressed This Visit     Severe recurrent major depression without psychotic features (HCC)   Mood appears fairly stable at this time given the situation and circumstances. Monitor closelely and continue current medications  Relevant Medications   ALPRAZolam (XANAX) 0.25 MG tablet   traZODone (DESYREL) 50 MG tablet   Systemic lupus erythematosus (HCC)   SLE contributing to recurrent miscarriages and flares post-miscarriage. Reports increased  severity of flares, including joint pain, muscle stiffness, and rashes. Stress and physical strain exacerbate symptoms. Flares are more severe post-miscarriage due to increased stress levels. - Monitor lupus symptoms and manage flares as needed. - Consider referral to a rheumatologist if symptoms persist or worsen.      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 Reports multiple Shaunn Tackitt miscarriages, with the most recent at eight weeks. Emotional and physical toll noted. Menstrual cycle changes include increased duration and heaviness of periods. Lupus increases miscarriage risk but does not preclude successful pregnancy. Emotional stress does not impact pregnancy viability. - Discussed lupus impact on pregnancy and miscarriage risk. - Advised that emotional stress does not impact pregnancy viability. - Periods may take up to six months to normalize post-miscarriage. - Consider emotional and physical readiness before attempting another pregnancy.      Hypoglycemia   Relevant Orders   TSH (Completed)   Insulin, random (Completed)   Primary insomnia   Relevant Medications   traZODone (DESYREL) 50 MG tablet   Other Relevant Orders   Cortisol (Completed)   Hemoglobin A1c (Completed)   Estradiol (Completed)   Prolactin   CBC with Differential (Completed)   Comprehensive metabolic panel (Completed)   Long term current use of systemic steroids   Reports weight gain, blood sugar dysregulation, night sweats, insomnia, muscle pain, and joint pain post-steroid withdrawal. Symptoms consistent with steroid withdrawal and may take up to six months to resolve. Steroid withdrawal can cause insulin and metabolism dysregulation, leading to weight gain and blood sugar issues. - Monitor blood sugar levels using Freestyle Libre monitor. - Prescribe low-dose trazodone for sleep, adjust as needed. - Discussed potential use of low-dose Xanax for  anxiety management, but concerns about misuse due to past substance use history. - Encouraged to continue healthy diet and exercise.      Relevant Medications   traZODone (DESYREL) 50 MG tablet   Other Relevant Orders   Cortisol (Completed)   Hemoglobin A1c (Completed)   Estradiol (Completed)   Prolactin   CBC with Differential (Completed)   Comprehensive metabolic panel (Completed)   TSH (Completed)   Insulin, random (Completed)   Panic attack as reaction to stress - Primary   Relevant Medications   ALPRAZolam (XANAX) 0.25 MG tablet   traZODone (DESYREL) 50 MG tablet   Other Relevant Orders   Cortisol (Completed)   Hemoglobin A1c (Completed)   Estradiol (Completed)   Prolactin   CBC with Differential (Completed)   Comprehensive metabolic panel (Completed)   Irritable bowel syndrome with both constipation and diarrhea   Reports episodes of diarrhea and abdominal cramps, potentially linked to stress and steroid withdrawal. Symptoms include severe cramps, chills, and prolonged diarrhea. IBS is closely linked with stress and emotional upheaval. - Recommend dietary modifications to identify and avoid trigger foods. - Advise use of Imodium for diarrhea management. - Consider probiotics to support gut health.      Other Visit Diagnoses       History of multiple miscarriages       Relevant Medications   traZODone (DESYREL) 50 MG tablet   Other Relevant Orders   Cortisol (Completed)   Hemoglobin A1c (Completed)   Estradiol (Completed)   Prolactin   CBC with Differential (Completed)  Comprehensive metabolic panel (Completed)       Return for tbd.  SaraBeth Erman Thum, DNP, AGNP-c  Time: 70 minutes, >50% spent counseling, care coordination, chart review, and documentation.

## 2023-04-10 LAB — COMPREHENSIVE METABOLIC PANEL
ALT: 34 [IU]/L — ABNORMAL HIGH (ref 0–32)
AST: 25 [IU]/L (ref 0–40)
Albumin: 4.8 g/dL (ref 4.0–5.0)
Alkaline Phosphatase: 80 [IU]/L (ref 44–121)
BUN/Creatinine Ratio: 12 (ref 9–23)
BUN: 10 mg/dL (ref 6–20)
Bilirubin Total: 0.5 mg/dL (ref 0.0–1.2)
CO2: 21 mmol/L (ref 20–29)
Calcium: 9.8 mg/dL (ref 8.7–10.2)
Chloride: 100 mmol/L (ref 96–106)
Creatinine, Ser: 0.86 mg/dL (ref 0.57–1.00)
Globulin, Total: 3.4 g/dL (ref 1.5–4.5)
Glucose: 88 mg/dL (ref 70–99)
Potassium: 4 mmol/L (ref 3.5–5.2)
Sodium: 139 mmol/L (ref 134–144)
Total Protein: 8.2 g/dL (ref 6.0–8.5)
eGFR: 93 mL/min/{1.73_m2} (ref >59)

## 2023-04-10 LAB — CBC WITH DIFFERENTIAL/PLATELET
Basophils Absolute: 0.1 10*3/uL (ref 0.0–0.2)
Basos: 1 %
EOS (ABSOLUTE): 0 10*3/uL (ref 0.0–0.4)
Eos: 0 %
Hematocrit: 41.7 % (ref 34.0–46.6)
Hemoglobin: 13.6 g/dL (ref 11.1–15.9)
Immature Grans (Abs): 0 10*3/uL (ref 0.0–0.1)
Immature Granulocytes: 0 %
Lymphocytes Absolute: 2.2 10*3/uL (ref 0.7–3.1)
Lymphs: 28 %
MCH: 28.5 pg (ref 26.6–33.0)
MCHC: 32.6 g/dL (ref 31.5–35.7)
MCV: 87 fL (ref 79–97)
Monocytes Absolute: 0.6 10*3/uL (ref 0.1–0.9)
Monocytes: 8 %
Neutrophils Absolute: 4.9 10*3/uL (ref 1.4–7.0)
Neutrophils: 63 %
Platelets: 292 10*3/uL (ref 150–450)
RBC: 4.78 x10E6/uL (ref 3.77–5.28)
RDW: 13.6 % (ref 11.7–15.4)
WBC: 7.8 10*3/uL (ref 3.4–10.8)

## 2023-04-10 LAB — CORTISOL: Cortisol: 4.1 ug/dL — ABNORMAL LOW (ref 6.2–19.4)

## 2023-04-10 LAB — ESTRADIOL: Estradiol: 61.7 pg/mL

## 2023-04-10 LAB — TSH: TSH: 0.724 u[IU]/mL (ref 0.450–4.500)

## 2023-04-10 LAB — HEMOGLOBIN A1C
Est. average glucose Bld gHb Est-mCnc: 105 mg/dL
Hgb A1c MFr Bld: 5.3 % (ref 4.8–5.6)

## 2023-04-10 LAB — INSULIN, RANDOM: INSULIN: 15.1 u[IU]/mL (ref 2.6–24.9)

## 2023-04-13 ENCOUNTER — Other Ambulatory Visit (HOSPITAL_BASED_OUTPATIENT_CLINIC_OR_DEPARTMENT_OTHER): Payer: Self-pay

## 2023-04-15 ENCOUNTER — Telehealth: Payer: Self-pay | Admitting: Nurse Practitioner

## 2023-04-15 ENCOUNTER — Encounter: Payer: Self-pay | Admitting: Nurse Practitioner

## 2023-04-15 DIAGNOSIS — F430 Acute stress reaction: Secondary | ICD-10-CM | POA: Insufficient documentation

## 2023-04-15 DIAGNOSIS — K582 Mixed irritable bowel syndrome: Secondary | ICD-10-CM | POA: Insufficient documentation

## 2023-04-15 DIAGNOSIS — E162 Hypoglycemia, unspecified: Secondary | ICD-10-CM | POA: Insufficient documentation

## 2023-04-15 DIAGNOSIS — F410 Panic disorder [episodic paroxysmal anxiety] without agoraphobia: Secondary | ICD-10-CM | POA: Insufficient documentation

## 2023-04-15 DIAGNOSIS — Z7952 Long term (current) use of systemic steroids: Secondary | ICD-10-CM | POA: Insufficient documentation

## 2023-04-15 DIAGNOSIS — F5101 Primary insomnia: Secondary | ICD-10-CM | POA: Insufficient documentation

## 2023-04-15 DIAGNOSIS — N96 Recurrent pregnancy loss: Secondary | ICD-10-CM | POA: Insufficient documentation

## 2023-04-15 NOTE — Assessment & Plan Note (Signed)
Reports weight gain, blood sugar dysregulation, night sweats, insomnia, muscle pain, and joint pain post-steroid withdrawal. Symptoms consistent with steroid withdrawal and may take up to six months to resolve. Steroid withdrawal can cause insulin and metabolism dysregulation, leading to weight gain and blood sugar issues. - Monitor blood sugar levels using Freestyle Libre monitor. - Prescribe low-dose trazodone for sleep, adjust as needed. - Discussed potential use of low-dose Xanax for anxiety management, but concerns about misuse due to past substance use history. - Encouraged to continue healthy diet and exercise.

## 2023-04-15 NOTE — Assessment & Plan Note (Signed)
Reports episodes of diarrhea and abdominal cramps, potentially linked to stress and steroid withdrawal. Symptoms include severe cramps, chills, and prolonged diarrhea. IBS is closely linked with stress and emotional upheaval. - Recommend dietary modifications to identify and avoid trigger foods. - Advise use of Imodium for diarrhea management. - Consider probiotics to support gut health.

## 2023-04-15 NOTE — Assessment & Plan Note (Signed)
SLE contributing to recurrent miscarriages and flares post-miscarriage. Reports increased severity of flares, including joint pain, muscle stiffness, and rashes. Stress and physical strain exacerbate symptoms. Flares are more severe post-miscarriage due to increased stress levels. - Monitor lupus symptoms and manage flares as needed. - Consider referral to a rheumatologist if symptoms persist or worsen.

## 2023-04-15 NOTE — Assessment & Plan Note (Signed)
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 Reports multiple Tara Villanueva miscarriages, with the most recent at eight weeks. Emotional and physical toll noted. Menstrual cycle changes include increased duration and heaviness of periods. Lupus increases miscarriage risk but does not preclude successful pregnancy. Emotional stress does not impact pregnancy viability. - Discussed lupus impact on pregnancy and miscarriage risk. - Advised that emotional stress does not impact pregnancy viability. - Periods may take up to six months to normalize post-miscarriage. - Consider emotional and physical readiness before attempting another pregnancy.

## 2023-04-15 NOTE — Assessment & Plan Note (Signed)
Mood appears fairly stable at this time given the situation and circumstances. Monitor closelely and continue current medications

## 2023-04-15 NOTE — Telephone Encounter (Signed)
Pt called and was interested in her lab results.

## 2023-04-17 ENCOUNTER — Encounter: Payer: Self-pay | Admitting: Nurse Practitioner

## 2023-04-21 ENCOUNTER — Other Ambulatory Visit: Payer: Self-pay

## 2023-04-21 ENCOUNTER — Other Ambulatory Visit (HOSPITAL_BASED_OUTPATIENT_CLINIC_OR_DEPARTMENT_OTHER): Payer: Self-pay

## 2023-04-21 MED ORDER — FREESTYLE LIBRE 3 PLUS SENSOR MISC
1 refills | Status: DC
  Filled 2023-04-21: qty 2, 28d supply, fill #0

## 2023-04-21 MED ORDER — FREESTYLE LIBRE 3 PLUS SENSOR MISC: 1 refills | Status: DC

## 2023-04-22 ENCOUNTER — Other Ambulatory Visit (HOSPITAL_BASED_OUTPATIENT_CLINIC_OR_DEPARTMENT_OTHER): Payer: Self-pay

## 2023-05-13 ENCOUNTER — Other Ambulatory Visit: Payer: Self-pay | Admitting: Nurse Practitioner

## 2023-05-13 DIAGNOSIS — F410 Panic disorder [episodic paroxysmal anxiety] without agoraphobia: Secondary | ICD-10-CM

## 2023-05-13 MED ORDER — ALPRAZOLAM 0.25 MG PO TABS: ORAL_TABLET | ORAL | 0 refills | Status: DC

## 2023-05-25 ENCOUNTER — Other Ambulatory Visit: Payer: Self-pay | Admitting: Medical

## 2023-05-25 ENCOUNTER — Encounter: Payer: Self-pay | Admitting: Nurse Practitioner

## 2023-05-25 DIAGNOSIS — M5440 Lumbago with sciatica, unspecified side: Secondary | ICD-10-CM

## 2023-06-10 ENCOUNTER — Telehealth: Payer: Self-pay | Admitting: Physician Assistant

## 2023-06-10 ENCOUNTER — Other Ambulatory Visit (HOSPITAL_BASED_OUTPATIENT_CLINIC_OR_DEPARTMENT_OTHER): Payer: Self-pay

## 2023-06-10 DIAGNOSIS — R3989 Other symptoms and signs involving the genitourinary system: Secondary | ICD-10-CM

## 2023-06-10 MED ORDER — PHENAZOPYRIDINE HCL 100 MG PO TABS
100.0000 mg | ORAL_TABLET | Freq: Three times a day (TID) | ORAL | 0 refills | Status: DC | PRN
  Filled 2023-06-10: qty 10, 4d supply, fill #0

## 2023-06-10 MED ORDER — SULFAMETHOXAZOLE-TRIMETHOPRIM 800-160 MG PO TABS
1.0000 | ORAL_TABLET | Freq: Two times a day (BID) | ORAL | 0 refills | Status: DC
Start: 2023-06-10 — End: 2023-12-03
  Filled 2023-06-10: qty 10, 5d supply, fill #0

## 2023-06-10 NOTE — Patient Instructions (Signed)
 Tara Villanueva, thank you for joining Tara Loveless, PA-C for today's virtual visit.  While this provider is not your primary care provider (PCP), if your PCP is located in our provider database this encounter information will be shared with them immediately following your visit.   A [facility] MyChart account gives you access to today's visit and all your visits, tests, and labs performed at [facility] " click here if you don't have a [facility] MyChart account or go to [email or web address]  Consent: (Patient) Tara Villanueva provided verbal consent for this virtual visit at the beginning of the encounter.  Current Medications:  Current Outpatient Medications:    phenazopyridine (PYRIDIUM) 100 MG tablet, Take 1 tablet (100 mg total) by mouth 3 (three) times daily as needed for pain., Disp: 10 tablet, Rfl: 0   sulfamethoxazole-trimethoprim (BACTRIM DS) 800-160 MG tablet, Take 1 tablet by mouth 2 (two) times daily., Disp: 10 tablet, Rfl: 0   albuterol (PROVENTIL) (2.5 MG/3ML) 0.083% nebulizer solution, Take 3 mLs (2.5 mg total) by nebulization every 6 (six) hours as needed for wheezing or shortness of breath., Disp: 150 mL, Rfl: 1   ALPRAZolam (XANAX) 0.25 MG tablet, Take 1/2 tab up to two times in a day for anxiety and panic., Disp: 10 tablet, Rfl: 0   Continuous Glucose Sensor (FREESTYLE LIBRE 3 PLUS SENSOR) MISC, Change sensor every 15 days., Disp: 2 each, Rfl: 1   traZODone (DESYREL) 50 MG tablet, Take 0.5-2 tablets (25-100 mg total) by mouth at bedtime as needed for sleep., Disp: 60 tablet, Rfl: 1   Medications ordered in this encounter:  Meds ordered this encounter  Medications   sulfamethoxazole-trimethoprim (BACTRIM DS) 800-160 MG tablet    Sig: Take 1 tablet by mouth 2 (two) times daily.    Dispense:  10 tablet    Refill:  0    Supervising Provider:   Merrilee Villanueva [[id number]]   phenazopyridine (PYRIDIUM) 100 MG tablet     Sig: Take 1 tablet (100 mg total) by mouth 3 (three) times daily as needed for pain.    Dispense:  10 tablet    Refill:  0    Supervising Provider:   Merrilee Villanueva [[id number]]     *If you need refills on other medications prior to your next appointment, please contact your pharmacy*  Follow-Up: Call back or seek an in-person evaluation if the symptoms worsen or if the condition fails to improve as anticipated.  [facility] [phone]  Other Instructions Urinary Tract Infection, Female A urinary tract infection (UTI) is an infection in your urinary tract. The urinary tract is made up of organs that make, store, and get rid of pee (urine) in your body. These organs include: The kidneys. The ureters. The bladder. The urethra. What are the causes? Most UTIs are caused by germs called bacteria. They may be in or near your genitals. These germs grow and cause swelling in your urinary tract. What increases the risk? You're more likely to get a UTI if: You're a female. The urethra is shorter in females than in males. You have a soft tube called a catheter that drains your pee. You can't control when you pee or poop. You have trouble peeing because of: A kidney stone. A urinary blockage. A nerve condition that affects your bladder. Not getting enough to drink. You're sexually active. You use a birth control inside your vagina, like spermicide. You're pregnant. You have low levels  of the hormone estrogen in your body. You're an older adult. You're also more likely to get a UTI if you have other health problems. These may include: Diabetes. A weak immune system. Your immune system is your body's defense system. Sickle cell disease. Injury of the spine. What are the signs or symptoms? Symptoms may include: Needing to pee right away. Peeing small amounts often. Pain or burning when you pee. Blood in your pee. Pee that smells bad or odd. Pain in your belly or  lower back. You may also: Feel confused. This may be the first symptom in older adults. Vomit. Not feel hungry. Feel tired or easily annoyed. Have a fever or chills. How is this diagnosed? A UTI is diagnosed based on your medical history and an exam. You may also have other tests. These may include: Pee tests. Blood tests. Tests for sexually transmitted infections (STIs). If you've had more than one UTI, you may need to have imaging studies done to find out why you keep getting them. How is this treated? A UTI can be treated by: Taking antibiotics or other medicines. Drinking enough fluid to keep your pee pale yellow. In rare cases, a UTI can cause a very bad condition called sepsis. Sepsis may be treated in the hospital. Follow these instructions at home: Medicines Take your medicines only as told by your health care provider. If you were given antibiotics, take them as told by your provider. Do not stop taking them even if you start to feel better. General instructions Make sure you: Pee often and fully. Do not hold your pee for a long time. Wipe from front to back after you pee or poop. Use each tissue only once when you wipe. Pee after you have sex. Do not douche or use sprays or powders in your genital area. Contact a health care provider if: Your symptoms don't get better after 1-2 days of taking antibiotics. Your symptoms go away and then come back. You have a fever or chills. You vomit or feel like you may vomit. Get help right away if: You have very bad pain in your back or lower belly. You faint. This information is not intended to replace advice given to you by your health care provider. Make sure you discuss any questions you have with your health care provider. Document Revised: 10/09/2022 Document Reviewed: 06/06/2022 Elsevier Patient Education  2024 Elsevier Inc.   If you have been instructed to have an in-person evaluation today at a local Urgent Care  facility, please use the link below. It will take you to a list of all of our available [facility], including address, phone number and hours of operation. Please do not delay care.  [facility]  If you or a family member do not have a primary care provider, use the link below to schedule a visit and establish care. When you choose a [facility] primary care physician or advanced practice provider, you gain a long-term partner in health. Find a Primary Care Provider  Learn more about [facility] in-office and virtual care options:  - Get Care Now
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
## 2023-07-02 ENCOUNTER — Other Ambulatory Visit (HOSPITAL_BASED_OUTPATIENT_CLINIC_OR_DEPARTMENT_OTHER): Payer: Self-pay

## 2023-07-02 ENCOUNTER — Ambulatory Visit: Payer: Self-pay

## 2023-07-02 VITALS — BP 122/77 | HR 76 | Wt 206.0 lb

## 2023-07-02 DIAGNOSIS — Z3201 Encounter for pregnancy test, result positive: Secondary | ICD-10-CM

## 2023-07-02 DIAGNOSIS — Z8759 Personal history of other complications of pregnancy, childbirth and the puerperium: Secondary | ICD-10-CM

## 2023-07-02 LAB — POCT URINE PREGNANCY: Preg Test, Ur: POSITIVE — AB

## 2023-07-02 MED ORDER — PROMETHAZINE HCL 25 MG PO TABS
25.0000 mg | ORAL_TABLET | Freq: Four times a day (QID) | ORAL | 1 refills | Status: DC | PRN
  Filled 2023-07-02: qty 30, 8d supply, fill #0

## 2023-07-02 NOTE — Progress Notes (Signed)
 Tara Villanueva here for a UPT. Pt had a positive upt at home. LMP is 06/06/23.     UPT in office Positive.    Reviewed medications and informed to start a PNV, if not already. Pt to follow up in 4 weeks for New OB Intake visit.    Prenatal care to be completed at [facility].  Consulted with Dr. Alto Atta, pt stated she was informed when she experienced 2 SAB in October, November that we would track HCG levels with her next pregnancy. Per Dr. Alto Atta okay to do since pt states that is what she was told, and what she requests be done. Informed pt we will f/u with results.

## 2023-07-03 LAB — BETA HCG QUANT (REF LAB): hCG Quant: 286 m[IU]/mL

## 2023-07-07 ENCOUNTER — Other Ambulatory Visit: Payer: Self-pay

## 2023-07-13 ENCOUNTER — Other Ambulatory Visit (HOSPITAL_BASED_OUTPATIENT_CLINIC_OR_DEPARTMENT_OTHER): Payer: Self-pay

## 2023-07-29 ENCOUNTER — Other Ambulatory Visit: Payer: Self-pay | Admitting: *Deleted

## 2023-07-29 ENCOUNTER — Encounter: Payer: Self-pay | Admitting: *Deleted

## 2023-07-30 LAB — OB RESULTS CONSOLE ANTIBODY SCREEN: Antibody Screen: NEGATIVE

## 2023-07-30 LAB — OB RESULTS CONSOLE ABO/RH: RH Type: POSITIVE

## 2023-07-30 LAB — OB RESULTS CONSOLE VARICELLA ZOSTER ANTIBODY, IGG: Varicella: IMMUNE

## 2023-07-30 LAB — OB RESULTS CONSOLE HIV ANTIBODY (ROUTINE TESTING): HIV: NONREACTIVE

## 2023-07-30 LAB — HEMOGLOBIN A1C: Hemoglobin A1C: 5.4

## 2023-07-30 LAB — OB RESULTS CONSOLE RUBELLA ANTIBODY, IGM: Rubella: IMMUNE

## 2023-07-30 LAB — OB RESULTS CONSOLE HGB/HCT, BLOOD
HCT: 40 (ref 29–41)
Hemoglobin: 13.2

## 2023-07-30 LAB — OB RESULTS CONSOLE PLATELET COUNT: Platelets: 266

## 2023-07-30 LAB — OB RESULTS CONSOLE HEPATITIS B SURFACE ANTIGEN: Hepatitis B Surface Ag: NEGATIVE

## 2023-07-30 LAB — OB RESULTS CONSOLE RPR: RPR: REACTIVE

## 2023-07-30 LAB — HEPATITIS C ANTIBODY: HCV Ab: NEGATIVE

## 2023-07-31 DIAGNOSIS — R7689 Other specified abnormal immunological findings in serum: Secondary | ICD-10-CM | POA: Insufficient documentation

## 2023-07-31 DIAGNOSIS — R768 Other specified abnormal immunological findings in serum: Secondary | ICD-10-CM | POA: Insufficient documentation

## 2023-07-31 HISTORY — DX: Other specified abnormal immunological findings in serum: R76.89

## 2023-08-06 LAB — OB RESULTS CONSOLE GC/CHLAMYDIA
Chlamydia: NEGATIVE
Neisseria Gonorrhea: NEGATIVE

## 2023-08-28 ENCOUNTER — Telehealth: Payer: Self-pay | Admitting: Nurse Practitioner

## 2023-08-28 DIAGNOSIS — R5383 Other fatigue: Secondary | ICD-10-CM
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
## 2023-09-29 LAB — OB RESULTS CONSOLE GC/CHLAMYDIA
Chlamydia: NEGATIVE
Neisseria Gonorrhea: NEGATIVE

## 2023-11-22 NOTE — H&P (Signed)
 [facility]  History and Physical  History  Tara Villanueva is a 33 y.o. female [phone] at 108w4d weeks with LMP   with estimated due date of 03/09/2024, who presents with reduced fetal movement   History of Present Illness For the last 2 days has not felt normal fetal movement. No LOF or VB, no ctx. Having some anxiety and depression with her husband away, has reduced appetite. PNC uncomplicated so far, next appt is later this week    Subjective OB History  Gravida Para Term Preterm AB Living  7 3 3  0 3 3  SAB IAB Ectopic Multiple Live Births  3 0 0 0 3    # Outcome Date GA Lbr Len/2nd Weight Sex Type Anes PTL Lv  7 Current           6 SAB 2024 [redacted]w[redacted]d    SAB     5 SAB 2024 [redacted]w[redacted]d    SAB     4 SAB 2023 [redacted]w[redacted]d    SAB     3 Term 10/06/12    F    LIV  2 Term 10/01/11    F      1 Term 06/14/08    Tara Villanueva   Past Medical History:  Diagnosis Date  . Anemia   . Asthma (*)   . Depression   . Kidney infection   . Lupus (systemic lupus erythematosus) (*) 2019  . UTI (urinary tract infection)    Past Surgical History:  Procedure Laterality Date  . Hernia repair      Allergies[1] Prior to Admission medications  Medication Sig Start Date End Date Taking? Authorizing Provider  aspirin (ECOTRIN LOW DOSE) EC tablet Take one tablet (81 mg dose) by mouth daily.    Historical Provider, MD  hydrOXYzine HCl (ATARAX) 25 mg tablet Take one tablet (25 mg dose) by mouth 3 (three) times a day as needed for Itching.    Historical Provider, MD   History reviewed. No pertinent family history. Social History[2] Review of Systems  Respiratory: Negative.    Cardiovascular: Negative.       Objective   Physical Examination  Heart Rate:  [92] 92 BP: (103)/(63) 103/63 Pain Score: 0-No pain                   Physical Exam Constitutional:      Appearance: Normal appearance.  Cardiovascular:     Rate and Rhythm: Normal rate and regular rhythm.  Pulmonary:      Effort: Pulmonary effort is normal. No respiratory distress.  Abdominal:     General: There is no distension.     Palpations: Abdomen is soft.     Tenderness: There is no abdominal tenderness.  Neurological:     Mental Status: She is alert.    Fetal monitoring:   Fetal Heart Rate Mode: External US  (11/22/23 1110 : Acute Interface, Incoming Flowsheet Results) Baseline Rate: 140 bpm (11/22/23 1110 : Acute Interface, Incoming Flowsheet Results) Baseline Classification: No Baseline Change (11/22/23 1110 : Acute Interface, Incoming Flowsheet Results) Variability: Moderate 6-25bpm (11/22/23 1110 : Acute Interface, Incoming Flowsheet Results) Accelerations: Present (11/22/23 1110 : Acute Interface, Incoming Flowsheet Results) Decelerations: None (11/22/23 1110 : Acute Interface, Incoming Flowsheet Results) OB Fetal Movement: Palpated (11/22/23 1110 : Acute Interface, Incoming Flowsheet Results)     Uterine activity: Mode: External;Palpation (11/22/23 1110 : Acute Interface, Incoming Flowsheet Results) Contraction Frequency (min): none seen or palpated (11/22/23 1110 :  Acute Interface, Warehouse manager)   Results  Prenatal Labs: No results found for: BLDGROUPING No results found for: RH No results found for: ABSCREEN      No results found for: HGB No results found for: HCT No results found for: PROTUR      No results found for: STREPB        Gonorrhea (GC) - External (no units)  Date Value  09/29/2023 Negative  08/06/2023 Negative   Chlamydia (CT) - External (no units)  Date Value  09/29/2023 Negative  08/06/2023 Negative   No results found for: RPR      No results found for: RUBELLA   No results found for: HBV No results found for: HIV  No results found for: PPD No results found for: TSH No results found for: HMPAP      No results found for: CF No results found for: CANAVAN No components found for: BETATHALASSEMIAHB  No results  found for: SICKLE  No results found for: TAYSACHS     Assessment  IUP at 24 weeks with reduced fetal movement   Plan  NST reactive, no other issues, stable for discharge, keep next OB appt as scheduled, encouraged PO hydration and small frequent meals   Electronically Signed: Krystal JONETTA Deaner, MD 11/22/2023 11:22 AM       [1] Allergies Allergen Reactions  . Cinnamon Hives  . Pineapple Hives  . Pork Allergy Hives  . Strawberry Hives  [2] Social History Socioeconomic History  . Marital status: Unknown  Tobacco Use  . Smoking status: Former    Types: Cigarettes  Substance and Sexual Activity  . Alcohol use: Not Currently  . Drug use: Not Currently    Types: Crack cocaine  . Sexual activity: Yes

## 2023-12-03 ENCOUNTER — Other Ambulatory Visit: Payer: Self-pay

## 2023-12-03 ENCOUNTER — Inpatient Hospital Stay (HOSPITAL_COMMUNITY)
Admission: AD | Admit: 2023-12-03 | Discharge: 2023-12-03 | Disposition: A | Discharge status: Home / Self Care | Payer: Self-pay | Attending: Family Medicine | Admitting: Family Medicine

## 2023-12-03 ENCOUNTER — Encounter (HOSPITAL_COMMUNITY): Payer: Self-pay | Admitting: Obstetrics and Gynecology

## 2023-12-03 ENCOUNTER — Other Ambulatory Visit (HOSPITAL_BASED_OUTPATIENT_CLINIC_OR_DEPARTMENT_OTHER): Payer: Self-pay

## 2023-12-03 DIAGNOSIS — J069 Acute upper respiratory infection, unspecified: Secondary | ICD-10-CM | POA: Insufficient documentation

## 2023-12-03 DIAGNOSIS — O368120 Decreased fetal movements, second trimester, not applicable or unspecified: Secondary | ICD-10-CM | POA: Insufficient documentation

## 2023-12-03 DIAGNOSIS — O98512 Other viral diseases complicating pregnancy, second trimester: Secondary | ICD-10-CM | POA: Insufficient documentation

## 2023-12-03 DIAGNOSIS — Z3A25 25 weeks gestation of pregnancy: Secondary | ICD-10-CM

## 2023-12-03 DIAGNOSIS — O26899 Other specified pregnancy related conditions, unspecified trimester: Secondary | ICD-10-CM

## 2023-12-03 DIAGNOSIS — O26892 Other specified pregnancy related conditions, second trimester: Secondary | ICD-10-CM

## 2023-12-03 DIAGNOSIS — N898 Other specified noninflammatory disorders of vagina: Secondary | ICD-10-CM

## 2023-12-03 LAB — WET PREP, GENITAL
Clue Cells Wet Prep HPF POC: NONE SEEN
Sperm: NONE SEEN
Trich, Wet Prep: NONE SEEN
WBC, Wet Prep HPF POC: <10 (ref <10)
Yeast Wet Prep HPF POC: NONE SEEN

## 2023-12-03 MED ORDER — ACETAMINOPHEN-CAFFEINE 500-65 MG PO TABS
2.0000 | ORAL_TABLET | Freq: Once | ORAL | Status: AC
  Administered 2023-12-03: 2 via ORAL
  Filled 2023-12-03: qty 2

## 2023-12-03 MED ORDER — CYCLOBENZAPRINE HCL 5 MG PO TABS
5.0000 mg | ORAL_TABLET | Freq: Three times a day (TID) | ORAL | 0 refills | Status: DC | PRN
  Filled 2023-12-03: qty 20, 7d supply, fill #0
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
## 2023-12-03 NOTE — Discharge Instructions (Signed)
 It was a pleasure taking care of you today.  Your baby looked wonderful on the monitor.  You did not have a BV or yeast infection.  For your muscle pain in your back I sent in some Flexeril  and you can start by taking 1 tablet but can increase to 2 tablets at a time if needed.  If you have any worsening symptoms or any issues please return for further evaluation.  I hope you have a great rest of your day!

## 2023-12-03 NOTE — MAU Note (Signed)
 Tara Villanueva is a 33 y.o. at 105w5d here in MAU reporting: she's feeling less fetal movement than usual.  States he's not moving like he usually moves around.  Also complains of increased pelvic pressure.  Denies VB or LOF.  LMP: 06/06/2023 Onset of complaint: yesterday Pain score: 6 Vitals:   12/03/23 0801  BP: 126/72  Pulse: (!) 101  Resp: 19  Temp: (!) 97.5 F (36.4 C)  SpO2: 99%     FHT: 164 bpm  Lab orders placed from triage: None

## 2023-12-09 ENCOUNTER — Ambulatory Visit (INDEPENDENT_AMBULATORY_CARE_PROVIDER_SITE_OTHER): Payer: Self-pay | Admitting: Family Medicine

## 2023-12-09 ENCOUNTER — Encounter: Payer: Self-pay | Admitting: Family Medicine

## 2023-12-09 ENCOUNTER — Other Ambulatory Visit: Payer: Self-pay

## 2023-12-09 VITALS — BP 113/73 | HR 96 | Wt 206.0 lb

## 2023-12-09 DIAGNOSIS — F332 Major depressive disorder, recurrent severe without psychotic features: Secondary | ICD-10-CM

## 2023-12-09 DIAGNOSIS — Z3A27 27 weeks gestation of pregnancy: Secondary | ICD-10-CM

## 2023-12-09 DIAGNOSIS — R87618 Other abnormal cytological findings on specimens from cervix uteri: Secondary | ICD-10-CM

## 2023-12-09 DIAGNOSIS — Z3A25 25 weeks gestation of pregnancy: Secondary | ICD-10-CM

## 2023-12-09 DIAGNOSIS — Z87898 Personal history of other specified conditions: Secondary | ICD-10-CM

## 2023-12-09 DIAGNOSIS — Z9889 Other specified postprocedural states: Secondary | ICD-10-CM

## 2023-12-09 DIAGNOSIS — O0990 Supervision of high risk pregnancy, unspecified, unspecified trimester: Secondary | ICD-10-CM | POA: Insufficient documentation

## 2023-12-09 DIAGNOSIS — M329 Systemic lupus erythematosus, unspecified: Secondary | ICD-10-CM

## 2023-12-09 DIAGNOSIS — O0992 Supervision of high risk pregnancy, unspecified, second trimester: Secondary | ICD-10-CM
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
## 2023-12-14 ENCOUNTER — Other Ambulatory Visit (HOSPITAL_BASED_OUTPATIENT_CLINIC_OR_DEPARTMENT_OTHER): Payer: Self-pay

## 2023-12-22 ENCOUNTER — Other Ambulatory Visit: Payer: Self-pay

## 2023-12-22 ENCOUNTER — Ambulatory Visit: Payer: Self-pay | Admitting: Obstetrics and Gynecology

## 2023-12-22 VITALS — BP 95/67 | HR 91 | Wt 205.3 lb

## 2023-12-22 DIAGNOSIS — O0990 Supervision of high risk pregnancy, unspecified, unspecified trimester: Secondary | ICD-10-CM

## 2023-12-22 DIAGNOSIS — R102 Pelvic and perineal pain unspecified side: Secondary | ICD-10-CM

## 2023-12-22 DIAGNOSIS — M329 Systemic lupus erythematosus, unspecified: Secondary | ICD-10-CM

## 2023-12-22 DIAGNOSIS — R7689 Other specified abnormal immunological findings in serum: Secondary | ICD-10-CM

## 2023-12-22 DIAGNOSIS — Z9889 Other specified postprocedural states: Secondary | ICD-10-CM

## 2023-12-22 DIAGNOSIS — O0993 Supervision of high risk pregnancy, unspecified, third trimester: Secondary | ICD-10-CM

## 2023-12-22 DIAGNOSIS — Z23 Encounter for immunization: Secondary | ICD-10-CM

## 2023-12-22 DIAGNOSIS — Z87898 Personal history of other specified conditions: Secondary | ICD-10-CM

## 2023-12-22 DIAGNOSIS — Z3A28 28 weeks gestation of pregnancy: Secondary | ICD-10-CM
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
## 2023-12-22 NOTE — Progress Notes (Signed)
 I have reviewed the notes, assessments, and/or procedures performed by Joesph Bracket, I concur with her documentation of Jasa Dundon.   FABIENE Orlando Mantis MD

## 2023-12-23 ENCOUNTER — Ambulatory Visit: Payer: Self-pay | Admitting: Obstetrics and Gynecology

## 2023-12-23 LAB — CBC
Hematocrit: 39.7 % (ref 34.0–46.6)
Hemoglobin: 12.5 g/dL (ref 11.1–15.9)
MCH: 28 pg (ref 26.6–33.0)
MCHC: 31.5 g/dL (ref 31.5–35.7)
MCV: 89 fL (ref 79–97)
Platelets: 291 x10E3/uL (ref 150–450)
RBC: 4.46 x10E6/uL (ref 3.77–5.28)
RDW: 13.8 % (ref 11.7–15.4)
WBC: 7.8 x10E3/uL (ref 3.4–10.8)

## 2023-12-23 LAB — TSH: TSH: 1.42 u[IU]/mL (ref 0.450–4.500)

## 2023-12-23 LAB — HIV ANTIBODY (ROUTINE TESTING W REFLEX): HIV Screen 4th Generation wRfx: NONREACTIVE

## 2023-12-23 LAB — RPR: RPR Ser Ql: NONREACTIVE

## 2023-12-29 ENCOUNTER — Other Ambulatory Visit: Payer: Self-pay

## 2023-12-29 ENCOUNTER — Ambulatory Visit: Payer: Self-pay | Attending: Obstetrics and Gynecology | Admitting: Physical Therapy

## 2023-12-29 DIAGNOSIS — R279 Unspecified lack of coordination: Secondary | ICD-10-CM | POA: Insufficient documentation

## 2023-12-29 DIAGNOSIS — R293 Abnormal posture: Secondary | ICD-10-CM | POA: Insufficient documentation

## 2023-12-29 DIAGNOSIS — M6281 Muscle weakness (generalized): Secondary | ICD-10-CM | POA: Insufficient documentation

## 2023-12-29 DIAGNOSIS — R102 Pelvic and perineal pain unspecified side: Secondary | ICD-10-CM | POA: Insufficient documentation

## 2023-12-29 DIAGNOSIS — M62838 Other muscle spasm: Secondary | ICD-10-CM | POA: Insufficient documentation
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
## 2023-12-30 ENCOUNTER — Ambulatory Visit: Payer: Self-pay | Admitting: Physical Therapy

## 2024-01-05 ENCOUNTER — Other Ambulatory Visit: Payer: Self-pay

## 2024-01-05 ENCOUNTER — Ambulatory Visit (INDEPENDENT_AMBULATORY_CARE_PROVIDER_SITE_OTHER): Payer: Self-pay | Admitting: Physician Assistant

## 2024-01-05 VITALS — BP 126/86 | HR 92 | Wt 211.0 lb

## 2024-01-05 DIAGNOSIS — M329 Systemic lupus erythematosus, unspecified: Secondary | ICD-10-CM

## 2024-01-05 DIAGNOSIS — Z3A31 31 weeks gestation of pregnancy: Secondary | ICD-10-CM

## 2024-01-05 DIAGNOSIS — O0990 Supervision of high risk pregnancy, unspecified, unspecified trimester: Secondary | ICD-10-CM

## 2024-01-05 DIAGNOSIS — O0993 Supervision of high risk pregnancy, unspecified, third trimester: Secondary | ICD-10-CM

## 2024-01-05 DIAGNOSIS — Z3A30 30 weeks gestation of pregnancy: Secondary | ICD-10-CM

## 2024-01-05 NOTE — BH Specialist Note (Unsigned)
 Pt did not arrive to video visit and did not answer the phone ; Unable to leave voicemail; left MyChart message for patient.

## 2024-01-06 ENCOUNTER — Ambulatory Visit: Payer: Self-pay | Admitting: Clinical

## 2024-01-06 ENCOUNTER — Encounter: Payer: Self-pay | Admitting: *Deleted

## 2024-01-06 DIAGNOSIS — O0990 Supervision of high risk pregnancy, unspecified, unspecified trimester: Secondary | ICD-10-CM

## 2024-01-06 DIAGNOSIS — Z91199 Patient's noncompliance with other medical treatment and regimen due to unspecified reason: Secondary | ICD-10-CM

## 2024-01-06 DIAGNOSIS — Z87898 Personal history of other specified conditions: Secondary | ICD-10-CM

## 2024-01-07 ENCOUNTER — Ambulatory Visit: Payer: Self-pay

## 2024-01-07 ENCOUNTER — Telehealth: Payer: Self-pay

## 2024-01-07 NOTE — Telephone Encounter (Signed)
 Called and left message for patient after no-show appointment for pelvic floor physical therapy. Explained our attendance policy. She was asked to call to confirm next appointment.
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
## 2024-01-12 ENCOUNTER — Other Ambulatory Visit: Payer: Self-pay

## 2024-01-12 DIAGNOSIS — M329 Systemic lupus erythematosus, unspecified: Secondary | ICD-10-CM

## 2024-01-13 ENCOUNTER — Other Ambulatory Visit: Payer: Self-pay

## 2024-01-13 ENCOUNTER — Other Ambulatory Visit: Payer: Self-pay | Admitting: Physician Assistant

## 2024-01-13 DIAGNOSIS — O26893 Other specified pregnancy related conditions, third trimester: Secondary | ICD-10-CM

## 2024-01-13 DIAGNOSIS — O0990 Supervision of high risk pregnancy, unspecified, unspecified trimester: Secondary | ICD-10-CM

## 2024-01-13 DIAGNOSIS — Z3A30 30 weeks gestation of pregnancy: Secondary | ICD-10-CM

## 2024-01-13 DIAGNOSIS — Z3A32 32 weeks gestation of pregnancy: Secondary | ICD-10-CM

## 2024-01-13 DIAGNOSIS — M329 Systemic lupus erythematosus, unspecified: Secondary | ICD-10-CM

## 2024-01-14 ENCOUNTER — Ambulatory Visit: Payer: Self-pay

## 2024-01-14 ENCOUNTER — Telehealth: Payer: Self-pay

## 2024-01-14 NOTE — Telephone Encounter (Signed)
 Called and left message for patient after 2nd no-show appointment in a row. She was informed per our attendance policy that we will cancel her remaining appointments, but she can call on a weekly basis to try and get an appointment if she would like. She was asked to call if she would like to be discharged.

## 2024-01-18 ENCOUNTER — Inpatient Hospital Stay (HOSPITAL_COMMUNITY)
Admission: AD | Admit: 2024-01-18 | Discharge: 2024-01-18 | Disposition: A | Discharge status: Home / Self Care | Payer: Self-pay | Attending: Obstetrics & Gynecology | Admitting: Obstetrics & Gynecology

## 2024-01-18 ENCOUNTER — Encounter (HOSPITAL_COMMUNITY): Payer: Self-pay | Admitting: Obstetrics & Gynecology

## 2024-01-18 DIAGNOSIS — O26893 Other specified pregnancy related conditions, third trimester: Secondary | ICD-10-CM | POA: Insufficient documentation

## 2024-01-18 DIAGNOSIS — O368130 Decreased fetal movements, third trimester, not applicable or unspecified: Secondary | ICD-10-CM | POA: Insufficient documentation

## 2024-01-18 DIAGNOSIS — M329 Systemic lupus erythematosus, unspecified: Secondary | ICD-10-CM | POA: Insufficient documentation

## 2024-01-18 DIAGNOSIS — O99810 Abnormal glucose complicating pregnancy: Secondary | ICD-10-CM | POA: Insufficient documentation

## 2024-01-18 DIAGNOSIS — Z3A32 32 weeks gestation of pregnancy: Secondary | ICD-10-CM

## 2024-01-18 DIAGNOSIS — F5089 Other specified eating disorder: Secondary | ICD-10-CM | POA: Insufficient documentation

## 2024-01-18 DIAGNOSIS — O99323 Drug use complicating pregnancy, third trimester: Secondary | ICD-10-CM | POA: Insufficient documentation

## 2024-01-18 DIAGNOSIS — O99353 Diseases of the nervous system complicating pregnancy, third trimester: Secondary | ICD-10-CM | POA: Insufficient documentation

## 2024-01-18 DIAGNOSIS — O99343 Other mental disorders complicating pregnancy, third trimester: Secondary | ICD-10-CM | POA: Insufficient documentation

## 2024-01-18 DIAGNOSIS — Z0110 Encounter for examination of ears and hearing without abnormal findings: Secondary | ICD-10-CM | POA: Insufficient documentation

## 2024-01-18 DIAGNOSIS — R102 Pelvic and perineal pain unspecified side: Secondary | ICD-10-CM | POA: Insufficient documentation

## 2024-01-18 DIAGNOSIS — F419 Anxiety disorder, unspecified: Secondary | ICD-10-CM | POA: Insufficient documentation

## 2024-01-18 DIAGNOSIS — Z87891 Personal history of nicotine dependence: Secondary | ICD-10-CM | POA: Insufficient documentation

## 2024-01-18 DIAGNOSIS — Z3689 Encounter for other specified antenatal screening: Secondary | ICD-10-CM

## 2024-01-18 DIAGNOSIS — O99891 Other specified diseases and conditions complicating pregnancy: Secondary | ICD-10-CM | POA: Insufficient documentation

## 2024-01-18 DIAGNOSIS — Z3493 Encounter for supervision of normal pregnancy, unspecified, third trimester: Secondary | ICD-10-CM

## 2024-01-18 DIAGNOSIS — M25559 Pain in unspecified hip: Secondary | ICD-10-CM | POA: Insufficient documentation

## 2024-01-18 LAB — URINALYSIS, ROUTINE W REFLEX MICROSCOPIC
Bilirubin Urine: NEGATIVE
Glucose, UA: NEGATIVE mg/dL
Hgb urine dipstick: NEGATIVE
Ketones, ur: NEGATIVE mg/dL
Nitrite: NEGATIVE
Protein, ur: NEGATIVE mg/dL
Specific Gravity, Urine: 1.008 (ref 1.005–1.030)
pH: 6 (ref 5.0–8.0)
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
## 2024-01-18 NOTE — MAU Note (Signed)
 Tara Villanueva is a 33 y.o. at [redacted]w[redacted]d here in MAU reporting: Abd feels tight and having pelvic pressure not contraction but  a lot  of constant pelvic pressure.  Has not felt baby move much today and when he does move it is very light. Denies any vag bleeding or discharge.   LMP:  Onset of complaint: today Pain score: 7 Vitals:   01/18/24 2018  BP: 126/71  Pulse: 92  Resp: 18  Temp: 98.5 F (36.9 C)     FHT: 134  Lab orders placed from triage: u/a

## 2024-01-18 NOTE — Discharge Instructions (Signed)
 What is a fetal movement count?  A fetal movement count  is the number of times that you feel your baby move during a certain amount of time. This may also be called a fetal kick count by some people, but fetal movement count is a more accurate term. Movements can be kicks, flutters, swishes, rolls, or jabs. A fetal movement count is recommended for every pregnant woman. You may be asked to start counting fetal movements as early as week 28 of your pregnancy. Pay attention to when your baby is most active. Your baby has times of activity and times to sleep just like you do! You may notice your baby's sleep and wake cycles. You may also notice things that make your baby move more. You should do a fetal movement count: When your baby is normally most active. At the same time each day. A good time to count movements is while you are resting, after having something to eat and drink.  How do I count fetal movements? Find a quiet, comfortable area. Sit, or lie down on your side. Write down the date, the start time and stop time, and the number of movements that you felt between those two times. Take this information with you to your health care visits. Write down your start time when you feel the first movement. Count kicks, flutters, swishes, rolls, and jabs. Any movement counts! You should feel at least 10 movements. You may stop counting after you have felt 10 movements, or if you have been counting for 2 hours. Write down the stop time. If you do not feel 10 movements in 2 hours, contact your health care provider for further instructions. Your health care provider may want to do additional tests to assess your baby's well-being. Contact a health care provider if: You feel fewer than 10 movements in 2 hours. Your baby is not moving like he or she usually does.

## 2024-01-19 ENCOUNTER — Other Ambulatory Visit: Payer: Self-pay

## 2024-01-19 ENCOUNTER — Ambulatory Visit: Payer: Self-pay | Admitting: Certified Nurse Midwife

## 2024-01-19 ENCOUNTER — Telehealth: Payer: Self-pay | Admitting: Family Medicine

## 2024-01-19 DIAGNOSIS — O0990 Supervision of high risk pregnancy, unspecified, unspecified trimester: Secondary | ICD-10-CM

## 2024-01-19 NOTE — Progress Notes (Signed)
 Patient did not come to appointment. May reschedule.  Camie Rote, MSN, CNM, RNC-OB Certified Nurse Midwife, [facility] 01/19/2024 1:15 PM

## 2024-01-19 NOTE — Telephone Encounter (Signed)
 Received a call from patient stating if her appointments were not before 2:00 pm, she cannot make it. She requested all appointments scheduled after 2:00 pm needed to be canceled. I tried to find her an earlier appointment, and explain to her the reason for needing to be scheduled.

## 2024-01-20 ENCOUNTER — Ambulatory Visit (INDEPENDENT_AMBULATORY_CARE_PROVIDER_SITE_OTHER): Payer: Self-pay | Admitting: Obstetrics and Gynecology

## 2024-01-20 ENCOUNTER — Encounter: Payer: Self-pay | Admitting: Obstetrics and Gynecology

## 2024-01-20 ENCOUNTER — Other Ambulatory Visit: Payer: Self-pay

## 2024-01-20 VITALS — BP 126/67 | HR 97 | Wt 210.0 lb

## 2024-01-20 DIAGNOSIS — Z3A33 33 weeks gestation of pregnancy: Secondary | ICD-10-CM

## 2024-01-20 DIAGNOSIS — M329 Systemic lupus erythematosus, unspecified: Secondary | ICD-10-CM

## 2024-01-20 DIAGNOSIS — O0993 Supervision of high risk pregnancy, unspecified, third trimester: Secondary | ICD-10-CM

## 2024-01-20 DIAGNOSIS — F32A Depression, unspecified: Secondary | ICD-10-CM

## 2024-01-20 DIAGNOSIS — F419 Anxiety disorder, unspecified: Secondary | ICD-10-CM

## 2024-01-20 DIAGNOSIS — O0990 Supervision of high risk pregnancy, unspecified, unspecified trimester: Secondary | ICD-10-CM

## 2024-01-20 DIAGNOSIS — Z9889 Other specified postprocedural states: Secondary | ICD-10-CM

## 2024-01-20 DIAGNOSIS — O99891 Other specified diseases and conditions complicating pregnancy: Secondary | ICD-10-CM

## 2024-01-20 DIAGNOSIS — Z8659 Personal history of other mental and behavioral disorders: Secondary | ICD-10-CM
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
## 2024-01-21 ENCOUNTER — Ambulatory Visit: Payer: Self-pay | Admitting: Obstetrics and Gynecology

## 2024-01-21 DIAGNOSIS — O0990 Supervision of high risk pregnancy, unspecified, unspecified trimester: Secondary | ICD-10-CM

## 2024-01-21 LAB — GLUCOSE TOLERANCE, 1 HOUR: Glucose, 1Hr PP: 115 mg/dL (ref 70–199)

## 2024-01-25 ENCOUNTER — Ambulatory Visit: Payer: Self-pay

## 2024-01-25 DIAGNOSIS — O99210 Obesity complicating pregnancy, unspecified trimester: Secondary | ICD-10-CM | POA: Insufficient documentation

## 2024-01-26 ENCOUNTER — Other Ambulatory Visit: Payer: Self-pay

## 2024-01-26 ENCOUNTER — Ambulatory Visit: Payer: Self-pay

## 2024-02-01 ENCOUNTER — Other Ambulatory Visit: Payer: Self-pay

## 2024-02-01 DIAGNOSIS — M329 Systemic lupus erythematosus, unspecified: Secondary | ICD-10-CM

## 2024-02-01 DIAGNOSIS — O0990 Supervision of high risk pregnancy, unspecified, unspecified trimester: Secondary | ICD-10-CM

## 2024-02-02 ENCOUNTER — Other Ambulatory Visit: Payer: Self-pay

## 2024-02-02 ENCOUNTER — Encounter: Payer: Self-pay | Admitting: Physician Assistant

## 2024-02-02 ENCOUNTER — Ambulatory Visit (INDEPENDENT_AMBULATORY_CARE_PROVIDER_SITE_OTHER): Payer: Self-pay

## 2024-02-02 DIAGNOSIS — O0990 Supervision of high risk pregnancy, unspecified, unspecified trimester: Secondary | ICD-10-CM

## 2024-02-02 DIAGNOSIS — M329 Systemic lupus erythematosus, unspecified: Secondary | ICD-10-CM

## 2024-02-04 ENCOUNTER — Ambulatory Visit: Payer: Self-pay

## 2024-02-04 ENCOUNTER — Ambulatory Visit: Payer: Self-pay | Admitting: Physician Assistant

## 2024-02-04 ENCOUNTER — Other Ambulatory Visit: Payer: Self-pay

## 2024-02-04 VITALS — BP 122/87 | HR 100 | Wt 214.0 lb

## 2024-02-04 DIAGNOSIS — R82998 Other abnormal findings in urine: Secondary | ICD-10-CM

## 2024-02-04 DIAGNOSIS — Z3A35 35 weeks gestation of pregnancy: Secondary | ICD-10-CM

## 2024-02-04 DIAGNOSIS — Z9889 Other specified postprocedural states: Secondary | ICD-10-CM

## 2024-02-04 DIAGNOSIS — Z6837 Body mass index (BMI) 37.0-37.9, adult: Secondary | ICD-10-CM

## 2024-02-04 DIAGNOSIS — M329 Systemic lupus erythematosus, unspecified: Secondary | ICD-10-CM

## 2024-02-04 DIAGNOSIS — O0993 Supervision of high risk pregnancy, unspecified, third trimester: Secondary | ICD-10-CM

## 2024-02-04 DIAGNOSIS — O0990 Supervision of high risk pregnancy, unspecified, unspecified trimester: Secondary | ICD-10-CM

## 2024-02-04 LAB — POCT URINALYSIS DIP (DEVICE)
Bilirubin Urine: NEGATIVE
Glucose, UA: NEGATIVE mg/dL
Hgb urine dipstick: NEGATIVE
Ketones, ur: NEGATIVE mg/dL
Nitrite: NEGATIVE
Protein, ur: NEGATIVE mg/dL
Specific Gravity, Urine: 1.015 (ref 1.005–1.030)
Urobilinogen, UA: 0.2 mg/dL (ref 0.0–1.0)
pH: 6.5 (ref 5.0–8.0)
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
## 2024-02-05 ENCOUNTER — Encounter (HOSPITAL_COMMUNITY): Payer: Self-pay | Admitting: Obstetrics & Gynecology

## 2024-02-05 ENCOUNTER — Other Ambulatory Visit: Payer: Self-pay

## 2024-02-05 ENCOUNTER — Inpatient Hospital Stay (HOSPITAL_COMMUNITY)
Admission: AD | Admit: 2024-02-05 | Discharge: 2024-02-05 | Disposition: A | Discharge status: Home / Self Care | Payer: Self-pay | Attending: Obstetrics & Gynecology | Admitting: Obstetrics & Gynecology

## 2024-02-05 DIAGNOSIS — J069 Acute upper respiratory infection, unspecified: Secondary | ICD-10-CM

## 2024-02-05 DIAGNOSIS — Z3A35 35 weeks gestation of pregnancy: Secondary | ICD-10-CM | POA: Insufficient documentation

## 2024-02-05 DIAGNOSIS — O0990 Supervision of high risk pregnancy, unspecified, unspecified trimester: Secondary | ICD-10-CM

## 2024-02-05 DIAGNOSIS — M329 Systemic lupus erythematosus, unspecified: Secondary | ICD-10-CM | POA: Insufficient documentation

## 2024-02-05 DIAGNOSIS — R519 Headache, unspecified: Secondary | ICD-10-CM | POA: Insufficient documentation

## 2024-02-05 DIAGNOSIS — O0993 Supervision of high risk pregnancy, unspecified, third trimester: Secondary | ICD-10-CM | POA: Insufficient documentation

## 2024-02-05 DIAGNOSIS — R1031 Right lower quadrant pain: Secondary | ICD-10-CM | POA: Insufficient documentation

## 2024-02-05 DIAGNOSIS — O99891 Other specified diseases and conditions complicating pregnancy: Secondary | ICD-10-CM | POA: Insufficient documentation

## 2024-02-05 DIAGNOSIS — O26893 Other specified pregnancy related conditions, third trimester: Secondary | ICD-10-CM | POA: Insufficient documentation

## 2024-02-05 DIAGNOSIS — M791 Myalgia, unspecified site: Secondary | ICD-10-CM | POA: Insufficient documentation

## 2024-02-05 DIAGNOSIS — R0981 Nasal congestion: Secondary | ICD-10-CM | POA: Insufficient documentation

## 2024-02-05 DIAGNOSIS — R1012 Left upper quadrant pain: Secondary | ICD-10-CM | POA: Insufficient documentation

## 2024-02-05 DIAGNOSIS — Z87898 Personal history of other specified conditions: Secondary | ICD-10-CM

## 2024-02-05 LAB — URINALYSIS, ROUTINE W REFLEX MICROSCOPIC
Bilirubin Urine: NEGATIVE
Glucose, UA: NEGATIVE mg/dL
Hgb urine dipstick: NEGATIVE
Ketones, ur: NEGATIVE mg/dL
Nitrite: NEGATIVE
Protein, ur: NEGATIVE mg/dL
Specific Gravity, Urine: 1.004 — ABNORMAL LOW (ref 1.005–1.030)
pH: 6 (ref 5.0–8.0)

## 2024-02-05 LAB — RESP PANEL BY RT-PCR (RSV, FLU A&B, COVID)  RVPGX2
Influenza A by PCR: NEGATIVE
Influenza B by PCR: NEGATIVE
Resp Syncytial Virus by PCR: NEGATIVE
SARS Coronavirus 2 by RT PCR: NEGATIVE

## 2024-02-05 LAB — CBC
HCT: 37.4 % (ref 36.0–46.0)
Hemoglobin: 12.4 g/dL (ref 12.0–15.0)
MCH: 27.7 pg (ref 26.0–34.0)
MCHC: 33.2 g/dL (ref 30.0–36.0)
MCV: 83.5 fL (ref 80.0–100.0)
Platelets: 259 K/uL (ref 150–400)
RBC: 4.48 MIL/uL (ref 3.87–5.11)
RDW: 14 % (ref 11.5–15.5)
WBC: 8.3 K/uL (ref 4.0–10.5)
nRBC: 0 % (ref 0.0–0.2)

## 2024-02-05 LAB — POCT FERN TEST: POCT Fern Test: NEGATIVE

## 2024-02-05 MED ORDER — LACTATED RINGERS IV BOLUS
1000.0000 mL | Freq: Once | INTRAVENOUS | Status: AC
  Administered 2024-02-05: 1000 mL via INTRAVENOUS

## 2024-02-05 MED ORDER — GUAIFENESIN ER 600 MG PO TB12: 600.0000 mg | ORAL_TABLET | Freq: Two times a day (BID) | ORAL | Status: DC

## 2024-02-05 MED ORDER — ACETAMINOPHEN-CAFFEINE 500-65 MG PO TABS: 2.0000 | ORAL_TABLET | Freq: Once | ORAL | Status: DC

## 2024-02-05 MED ORDER — GUAIFENESIN ER 600 MG PO TB12
600.0000 mg | ORAL_TABLET | Freq: Once | ORAL | Status: DC
  Filled 2024-02-05: qty 1
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
## 2024-02-05 NOTE — MAU Note (Addendum)
 Tara Villanueva is a 33 y.o. at [redacted]w[redacted]d here in MAU reporting: body aches, congestions and HA since Sunday. SOB currently due to congestion. Contractions last 2 nights. Possible fever but has not actually taken it. She reports +FMs, Denies LOF, VB, blurry vision, peripheral edema, or RUQ pain.   LMP: na Onset of complaint: Sunday Pain score: HA 8/10 abd 7/10 Vitals:   02/05/24 1214  BP: (!) 126/90  Pulse: (!) 104  SpO2: 99%     FHT: 152  Lab orders placed from triage: done by np

## 2024-02-06 LAB — PROTEIN / CREATININE RATIO, URINE
Creatinine, Urine: 31.7 mg/dL
Protein, Ur: <4 mg/dL

## 2024-02-07 ENCOUNTER — Ambulatory Visit: Payer: Self-pay | Admitting: Physician Assistant

## 2024-02-07 LAB — URINE CULTURE: Organism ID, Bacteria: NO GROWTH

## 2024-02-08 ENCOUNTER — Other Ambulatory Visit: Payer: Self-pay

## 2024-02-08 ENCOUNTER — Ambulatory Visit: Payer: Self-pay

## 2024-02-08 ENCOUNTER — Other Ambulatory Visit: Payer: Self-pay | Admitting: *Deleted

## 2024-02-08 DIAGNOSIS — O0990 Supervision of high risk pregnancy, unspecified, unspecified trimester: Secondary | ICD-10-CM

## 2024-02-08 DIAGNOSIS — M329 Systemic lupus erythematosus, unspecified: Secondary | ICD-10-CM

## 2024-02-08 DIAGNOSIS — O99210 Obesity complicating pregnancy, unspecified trimester: Secondary | ICD-10-CM

## 2024-02-09 ENCOUNTER — Ambulatory Visit (INDEPENDENT_AMBULATORY_CARE_PROVIDER_SITE_OTHER): Payer: Self-pay

## 2024-02-09 ENCOUNTER — Other Ambulatory Visit: Payer: Self-pay

## 2024-02-09 ENCOUNTER — Ambulatory Visit: Payer: Self-pay | Admitting: Physical Therapy

## 2024-02-09 DIAGNOSIS — M329 Systemic lupus erythematosus, unspecified: Secondary | ICD-10-CM

## 2024-02-09 DIAGNOSIS — O0990 Supervision of high risk pregnancy, unspecified, unspecified trimester: Secondary | ICD-10-CM

## 2024-02-12 ENCOUNTER — Other Ambulatory Visit (HOSPITAL_BASED_OUTPATIENT_CLINIC_OR_DEPARTMENT_OTHER): Payer: Self-pay

## 2024-02-15 ENCOUNTER — Other Ambulatory Visit: Payer: Self-pay

## 2024-02-15 ENCOUNTER — Ambulatory Visit: Payer: Self-pay

## 2024-02-15 ENCOUNTER — Ambulatory Visit: Payer: Self-pay | Admitting: Family Medicine

## 2024-02-15 ENCOUNTER — Encounter: Payer: Self-pay | Admitting: Family Medicine

## 2024-02-15 ENCOUNTER — Other Ambulatory Visit (HOSPITAL_COMMUNITY)
Admission: RE | Admit: 2024-02-15 | Discharge: 2024-02-15 | Disposition: A | Discharge status: Home / Self Care | Payer: Self-pay | Source: Ambulatory Visit | Attending: Family Medicine | Admitting: Family Medicine

## 2024-02-15 VITALS — BP 125/90 | HR 112 | Wt 211.1 lb

## 2024-02-15 DIAGNOSIS — O0990 Supervision of high risk pregnancy, unspecified, unspecified trimester: Secondary | ICD-10-CM

## 2024-02-15 DIAGNOSIS — Z3A36 36 weeks gestation of pregnancy: Secondary | ICD-10-CM

## 2024-02-15 DIAGNOSIS — M329 Systemic lupus erythematosus, unspecified: Secondary | ICD-10-CM

## 2024-02-15 DIAGNOSIS — L299 Pruritus, unspecified: Secondary | ICD-10-CM
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
## 2024-02-16 LAB — GC/CHLAMYDIA PROBE AMP (~~LOC~~) NOT AT ARMC
Chlamydia: NEGATIVE
Comment: NEGATIVE
Comment: NORMAL
Neisseria Gonorrhea: NEGATIVE

## 2024-02-17 ENCOUNTER — Ambulatory Visit: Payer: Self-pay

## 2024-02-18 ENCOUNTER — Ambulatory Visit: Payer: Self-pay | Admitting: Family Medicine

## 2024-02-18 ENCOUNTER — Telehealth: Payer: Self-pay | Admitting: Family Medicine

## 2024-02-18 LAB — C3 COMPLEMENT: Complement C3, Serum: 175 mg/dL — ABNORMAL HIGH (ref 82–167)

## 2024-02-18 LAB — C4 COMPLEMENT: Complement C4, Serum: 19 mg/dL (ref 12–38)

## 2024-02-18 LAB — COMPREHENSIVE METABOLIC PANEL WITH GFR
ALT: 9 IU/L (ref 0–32)
AST: 14 IU/L (ref 0–40)
Albumin: 3.8 g/dL — ABNORMAL LOW (ref 3.9–4.9)
Alkaline Phosphatase: 158 IU/L — ABNORMAL HIGH (ref 41–116)
BUN/Creatinine Ratio: 16 (ref 9–23)
BUN: 11 mg/dL (ref 6–20)
Bilirubin Total: 0.3 mg/dL (ref 0.0–1.2)
CO2: 16 mmol/L — ABNORMAL LOW (ref 20–29)
Calcium: 9.9 mg/dL (ref 8.7–10.2)
Chloride: 101 mmol/L (ref 96–106)
Creatinine, Ser: 0.68 mg/dL (ref 0.57–1.00)
Globulin, Total: 3.3 g/dL (ref 1.5–4.5)
Glucose: 93 mg/dL (ref 70–99)
Potassium: 4.2 mmol/L (ref 3.5–5.2)
Sodium: 132 mmol/L — ABNORMAL LOW (ref 134–144)
Total Protein: 7.1 g/dL (ref 6.0–8.5)
eGFR: 119 mL/min/1.73 (ref >59)

## 2024-02-18 LAB — ANA: Anti Nuclear Antibody (ANA): POSITIVE — AB

## 2024-02-18 LAB — COMPLEMENT, TOTAL: Compl, Total (CH50): >60 U/mL (ref >41)

## 2024-02-18 LAB — CULTURE, BETA STREP (GROUP B ONLY): Strep Gp B Culture: NEGATIVE

## 2024-02-18 LAB — BILE ACIDS, TOTAL: Bile Acids Total: 5.8 umol/L (ref 0.0–10.0)

## 2024-02-18 NOTE — Telephone Encounter (Signed)
 Calling regarding induction and lab work, patient would like a call back asap.

## 2024-02-19 ENCOUNTER — Other Ambulatory Visit: Payer: Self-pay | Admitting: Family Medicine

## 2024-02-19 DIAGNOSIS — L299 Pruritus, unspecified: Secondary | ICD-10-CM

## 2024-02-19 DIAGNOSIS — M329 Systemic lupus erythematosus, unspecified: Secondary | ICD-10-CM

## 2024-02-19 NOTE — Progress Notes (Signed)
 Orders for IOL due to cholestasis (normal labs but clinically itching) and SLE flare with joint pain, per pt, with normal labs. Reviewed with MFM who agreed to 38 week induction.

## 2024-02-22 ENCOUNTER — Encounter: Payer: Self-pay | Admitting: Family Medicine

## 2024-02-22 ENCOUNTER — Other Ambulatory Visit: Payer: Self-pay

## 2024-02-22 ENCOUNTER — Encounter: Payer: Self-pay | Admitting: Obstetrics and Gynecology

## 2024-02-23 ENCOUNTER — Inpatient Hospital Stay (HOSPITAL_COMMUNITY)
Admission: AD | Admit: 2024-02-23 | Discharge: 2024-02-23 | Disposition: A | Discharge status: Home / Self Care | Payer: Self-pay | Attending: Obstetrics & Gynecology | Admitting: Obstetrics & Gynecology

## 2024-02-23 ENCOUNTER — Ambulatory Visit: Payer: Self-pay

## 2024-02-23 ENCOUNTER — Other Ambulatory Visit: Payer: Self-pay

## 2024-02-23 ENCOUNTER — Encounter (HOSPITAL_COMMUNITY): Payer: Self-pay | Admitting: Obstetrics & Gynecology

## 2024-02-23 DIAGNOSIS — Z3A37 37 weeks gestation of pregnancy: Secondary | ICD-10-CM

## 2024-02-23 MED ORDER — ACETAMINOPHEN 325 MG PO TABS
650.0000 mg | ORAL_TABLET | Freq: Once | ORAL | Status: AC
  Administered 2024-02-23: 650 mg via ORAL
  Filled 2024-02-23: qty 2

## 2024-02-23 NOTE — MAU Note (Signed)
 MAU Triage Note  Tara Villanueva is a 33 y.o. at [redacted]w[redacted]d here in MAU reporting: reports ctx q3-63min for 2 hours as well as pelvic pressure. Denies VB and LOF. Endorses +FM.   Induction scheduled for tomorrow for cholestasis.    Onset of complaint: 2 hours Pain score: 8/10 Vitals:   02/23/24 1451  BP: 126/80  Pulse: 92  Resp: 17  Temp: 98.3 F (36.8 C)  SpO2: 98%     FHT: 135  Lab orders placed from triage: mau labor

## 2024-02-23 NOTE — MAU Note (Signed)
 MAU Labor Evaluation RN Medical Screening Exam: RN Assessment:  .Tara Villanueva is a 33 y.o. at [redacted]w[redacted]d here in MAU for evaluation of labor. See RN triage note.   Pain Score: 5  Pain Location: Head  Cervical exam:  Dilation: 1 Effacement (%): 80 Cervical Position: Posterior Station: -3 Presentation: Vertex Exam by:: GEANNIE Punter, RN  Fetal Monitoring: Baseline Rate (A): 135 bpm Variability: Moderate Accelerations: 15 x 15 Decelerations: None Contraction Frequency (min): 2.5-6  Vitals:   02/23/24 1511 02/23/24 1710  BP: 121/76 125/76  Pulse: 92 84  Resp:    Temp:    SpO2: 99% 99%      Medical Decision Making & Provider Communication:  This RN has communicated with: Provider Name/Title: Trudy, MD   Plan of Care: Labor eval with SVE recheck in 2 hour(s)       ,

## 2024-02-24 ENCOUNTER — Other Ambulatory Visit: Payer: Self-pay

## 2024-02-24 ENCOUNTER — Inpatient Hospital Stay (HOSPITAL_COMMUNITY): Payer: MEDICAID

## 2024-02-24 ENCOUNTER — Encounter (HOSPITAL_COMMUNITY): Payer: Self-pay | Admitting: Family Medicine

## 2024-02-24 ENCOUNTER — Inpatient Hospital Stay (HOSPITAL_COMMUNITY)
Admission: RE | Admit: 2024-02-24 | Discharge: 2024-02-26 | DRG: 807 | Disposition: A | Payer: MEDICAID | Attending: Family Medicine | Admitting: Family Medicine

## 2024-02-24 DIAGNOSIS — Z7982 Long term (current) use of aspirin: Secondary | ICD-10-CM | POA: Diagnosis not present

## 2024-02-24 DIAGNOSIS — Z833 Family history of diabetes mellitus: Secondary | ICD-10-CM | POA: Diagnosis not present

## 2024-02-24 DIAGNOSIS — O26643 Intrahepatic cholestasis of pregnancy, third trimester: Principal | ICD-10-CM | POA: Diagnosis present

## 2024-02-24 DIAGNOSIS — Z87891 Personal history of nicotine dependence: Secondary | ICD-10-CM

## 2024-02-24 DIAGNOSIS — Z3A38 38 weeks gestation of pregnancy: Secondary | ICD-10-CM

## 2024-02-24 DIAGNOSIS — O99892 Other specified diseases and conditions complicating childbirth: Secondary | ICD-10-CM | POA: Diagnosis present

## 2024-02-24 DIAGNOSIS — M329 Systemic lupus erythematosus, unspecified: Secondary | ICD-10-CM | POA: Diagnosis present

## 2024-02-24 DIAGNOSIS — L299 Pruritus, unspecified: Secondary | ICD-10-CM

## 2024-02-24 DIAGNOSIS — O2662 Liver and biliary tract disorders in childbirth: Secondary | ICD-10-CM | POA: Diagnosis not present

## 2024-02-24 DIAGNOSIS — O0990 Supervision of high risk pregnancy, unspecified, unspecified trimester: Secondary | ICD-10-CM

## 2024-02-24 DIAGNOSIS — K831 Obstruction of bile duct: Secondary | ICD-10-CM | POA: Diagnosis present

## 2024-02-24 DIAGNOSIS — O99214 Obesity complicating childbirth: Secondary | ICD-10-CM | POA: Diagnosis not present

## 2024-02-24 LAB — CBC
HCT: 35.6 % — ABNORMAL LOW (ref 36.0–46.0)
Hemoglobin: 11.6 g/dL — ABNORMAL LOW (ref 12.0–15.0)
MCH: 27.2 pg (ref 26.0–34.0)
MCHC: 32.6 g/dL (ref 30.0–36.0)
MCV: 83.4 fL (ref 80.0–100.0)
Platelets: 269 K/uL (ref 150–400)
RBC: 4.27 MIL/uL (ref 3.87–5.11)
RDW: 14.6 % (ref 11.5–15.5)
WBC: 8.7 K/uL (ref 4.0–10.5)
nRBC: 0 % (ref 0.0–0.2)

## 2024-02-24 LAB — SYPHILIS: RPR W/REFLEX TO RPR TITER AND TREPONEMAL ANTIBODIES, TRADITIONAL SCREENING AND DIAGNOSIS ALGORITHM
RPR Ser Ql: REACTIVE — AB
RPR Titer: 1:1 {titer}

## 2024-02-24 LAB — TYPE AND SCREEN
ABO/RH(D): O POS
Antibody Screen: NEGATIVE

## 2024-02-24 MED ORDER — FENTANYL CITRATE (PF) 100 MCG/2ML IJ SOLN
50.0000 ug | INTRAMUSCULAR | Status: DC | PRN
  Administered 2024-02-24: 100 ug via INTRAVENOUS
  Administered 2024-02-24: 50 ug via INTRAVENOUS
  Filled 2024-02-24 (×2): qty 2

## 2024-02-24 MED ORDER — ACETAMINOPHEN 325 MG PO TABS: 650.0000 mg | ORAL_TABLET | ORAL | Status: DC | PRN

## 2024-02-24 MED ORDER — LACTATED RINGERS IV SOLN: 500.0000 mL | INTRAVENOUS | Status: DC | PRN

## 2024-02-24 MED ORDER — MISOPROSTOL 50MCG HALF TABLET: 50.0000 ug | ORAL_TABLET | Freq: Once | ORAL | Status: DC

## 2024-02-24 MED ORDER — TERBUTALINE SULFATE 1 MG/ML IJ SOLN: 0.2500 mg | Freq: Once | INTRAMUSCULAR | Status: DC | PRN

## 2024-02-24 MED ORDER — LIDOCAINE HCL (PF) 1 % IJ SOLN: 30.0000 mL | INTRAMUSCULAR | Status: DC | PRN

## 2024-02-24 MED ORDER — MISOPROSTOL 25 MCG QUARTER TABLET
25.0000 ug | ORAL_TABLET | Freq: Once | ORAL | Status: AC
  Administered 2024-02-24: 25 ug via VAGINAL
  Filled 2024-02-24: qty 1

## 2024-02-24 MED ORDER — LACTATED RINGERS IV SOLN: INTRAVENOUS | Status: DC

## 2024-02-24 MED ORDER — SOD CITRATE-CITRIC ACID 500-334 MG/5ML PO SOLN: 30.0000 mL | ORAL | Status: DC | PRN

## 2024-02-24 MED ORDER — SODIUM CHLORIDE 0.9 % IV SOLN: 2.0000 g | Freq: Four times a day (QID) | INTRAVENOUS | Status: DC

## 2024-02-24 MED ORDER — FENTANYL CITRATE (PF) 100 MCG/2ML IJ SOLN
INTRAMUSCULAR | Status: AC
  Administered 2024-02-24: 50 ug
  Filled 2024-02-24: qty 2

## 2024-02-24 MED ORDER — OXYTOCIN-SODIUM CHLORIDE 30-0.9 UT/500ML-% IV SOLN
1.0000 m[IU]/min | INTRAVENOUS | Status: DC
  Administered 2024-02-24: 2 m[IU]/min via INTRAVENOUS
  Filled 2024-02-24: qty 500

## 2024-02-24 MED ORDER — OXYCODONE-ACETAMINOPHEN 5-325 MG PO TABS: 2.0000 | ORAL_TABLET | ORAL | Status: DC | PRN

## 2024-02-24 MED ORDER — GENTAMICIN SULFATE 40 MG/ML IJ SOLN
5.0000 mg/kg | INTRAVENOUS | Status: DC
  Filled 2024-02-24: qty 8.5

## 2024-02-24 MED ORDER — PSEUDOEPHEDRINE HCL 30 MG PO TABS
30.0000 mg | ORAL_TABLET | ORAL | Status: DC | PRN
  Administered 2024-02-24 (×2): 30 mg via ORAL
  Filled 2024-02-24 (×3): qty 1

## 2024-02-24 MED ORDER — OXYTOCIN BOLUS FROM INFUSION
333.0000 mL | Freq: Once | INTRAVENOUS | Status: AC
  Administered 2024-02-24: 333 mL via INTRAVENOUS

## 2024-02-24 MED ORDER — OXYTOCIN-SODIUM CHLORIDE 30-0.9 UT/500ML-% IV SOLN
2.5000 [IU]/h | INTRAVENOUS | Status: DC
  Administered 2024-02-24: 2.5 [IU]/h via INTRAVENOUS
  Filled 2024-02-24: qty 500

## 2024-02-24 MED ORDER — OXYCODONE-ACETAMINOPHEN 5-325 MG PO TABS: 1.0000 | ORAL_TABLET | ORAL | Status: DC | PRN

## 2024-02-24 MED ORDER — FLEET ENEMA RE ENEM: 1.0000 | ENEMA | RECTAL | Status: DC | PRN

## 2024-02-24 MED ORDER — ACETAMINOPHEN 500 MG PO TABS: 1000.0000 mg | ORAL_TABLET | Freq: Four times a day (QID) | ORAL | Status: DC | PRN

## 2024-02-24 MED ORDER — ONDANSETRON HCL 4 MG/2ML IJ SOLN: 4.0000 mg | Freq: Four times a day (QID) | INTRAMUSCULAR | Status: DC | PRN
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
## 2024-02-24 NOTE — Discharge Summary (Signed)
 Postpartum Discharge Summary  Date of Service updated***     Patient Name: Tara Villanueva DOB: March 07, 1991 MRN: [id number]  Date of admission: 02/24/2024 Delivery date:02/24/2024 Delivering provider: LORELI IHA D Date of discharge: 02/24/2024  Admitting diagnosis: Cholestasis (HCC) [K83.1] Intrauterine pregnancy: [redacted]w[redacted]d     Secondary diagnosis:  Principal Problem:   Cholestasis (HCC) Active Problems:   Systemic lupus erythematosus (HCC)  Additional problems: none    Discharge diagnosis: Term Pregnancy Delivered                                              Post partum procedures:none Augmentation: AROM, Pitocin , Cytotec , and IP Foley Complications: None  Hospital course: Induction of Labor With Vaginal Delivery   33 y.o. yo H2E5965 at [redacted]w[redacted]d was admitted to the hospital 02/24/2024 for induction of labor.  Indication for induction: Cholestasis of pregnancy.  Patient had an uncomplicated labor course. Membrane Rupture Time/Date: 8:06 PM,02/24/2024  Delivery Method:Vaginal, Spontaneous Operative Delivery:N/A Episiotomy: None Lacerations:  None Details of delivery can be found in separate delivery note.  Patient had a postpartum course complicated by***. Patient is discharged home 02/24/24.  Newborn Data: Birth date:02/24/2024 Birth time:10:27 PM Gender:Female Living status:Living Apgars:7 ,9  Weight:   Magnesium  Sulfate received: No BMZ received: No Rhophylac:N/A MMR:N/A T-DaP:Given prenatally Flu: No RSV Vaccine received: No Transfusion:No  Immunizations received: Immunization History  Administered Date(s) Administered   H1N1 02/02/2008   PFIZER(Purple Top)SARS-COV-2 Vaccination 07/31/2019, 08/21/2019   Tdap 09/11/2011, 09/23/2012, 12/22/2023    Physical exam  Vitals:   02/24/24 1915 02/24/24 1917 02/24/24 2004 02/24/24 2019  BP:  114/62 128/76 122/72  Pulse:  92 79 91  Resp:  18    Temp: 97.8 F (36.6 C)     TempSrc: Oral      Weight:      Height:       General: {Exam; general:21111117} Lochia: {Desc; appropriate/inappropriate:30686::appropriate} Uterine Fundus: {Desc; firm/soft:30687} Incision: {Exam; incision:21111123} DVT Evaluation: {Exam; dvt:2111122} Labs: Lab Results  Component Value Date   WBC 8.7 02/24/2024   HGB 11.6 (L) 02/24/2024   HCT 35.6 (L) 02/24/2024   MCV 83.4 02/24/2024   PLT 269 02/24/2024      Latest Ref Rng & Units 02/15/2024   12:03 PM  CMP  Glucose 70 - 99 mg/dL 93   BUN 6 - 20 mg/dL 11   Creatinine 9.42 - 1.00 mg/dL 9.31   Sodium 865 - 855 mmol/L 132   Potassium 3.5 - 5.2 mmol/L 4.2   Chloride 96 - 106 mmol/L 101   CO2 20 - 29 mmol/L 16   Calcium 8.7 - 10.2 mg/dL 9.9   Total Protein 6.0 - 8.5 g/dL 7.1   Total Bilirubin 0.0 - 1.2 mg/dL 0.3   Alkaline Phos 41 - 116 IU/L 158   AST 0 - 40 IU/L 14   ALT 0 - 32 IU/L 9    Edinburgh Score:     No data to display         No data recorded  After visit meds:  Allergies as of 02/24/2024       Reactions   Cinnamon Itching, Swelling, Dermatitis, Hives   Pineapple Itching, Swelling, Dermatitis, Hives   Strawberry Extract Hives   Cashew Nut (anacardium Occidentale) Skin Test Rash   Kiwi Extract Rash   BREAKOUT ON LIPS  Porcine (pork) Protein-containing Drug Products Rash     Med Rec must be completed prior to using this SMARTLINK***        Discharge home in stable condition Infant Feeding: {Baby feeding:23562} Infant Disposition:{CHL IP OB HOME WITH FNUYZM:76418} Discharge instruction: per After Visit Summary and Postpartum booklet. Activity: Advance as tolerated. Pelvic rest for 6 weeks.  Diet: routine diet Future Appointments: Future Appointments  Date Time Provider Department Center  03/01/2024  1:55 PM [facility] US2 [facility]  03/16/2024  1:55 PM [facility] US2 [facility]   Follow up Visit:  Loreli Suzen BIRCH, CNM  P [facility] Admin Pool Please schedule this patient for Postpartum visit in: 4-6  weeks with the following provider: Any provider In-Person For C/S patients schedule nurse incision check in weeks 2 weeks: no High risk pregnancy complicated by: cholestasis Delivery mode:  SVD Anticipated Birth Control:  other/unsure PP Procedures needed: none Schedule Integrated BH visit: no   02/24/2024 Suzen BIRCH Loreli, CNM

## 2024-02-24 NOTE — Progress Notes (Signed)
 Labor Progress Note Tara Villanueva is a 33 y.o. [phone] at [redacted]w[redacted]d presented for IOL due to cholestasis and lupus flairs.   S:  Resting in bed. Feeling contractions occasionally.  O:  BP 125/81   Pulse 73   Temp 98.4 F (36.9 C) (Oral)   Resp 20   Ht 5' 2 (1.575 m)   Wt 95 kg   LMP 06/03/2023   BMI 38.32 kg/m   EFM: baseline 125 bpm/ mod variability/ 15x15 accels/ none decels  Toco/IUPC: 3-4 SVE: Dilation: 3.5 Effacement (%): Thick Cervical Position: Posterior Station: -3, -2 Presentation: Vertex Exam by:: Conard Me SNM Pitocin : 16 mu/min  A/P: 33 y.o. H2E6966 [redacted]w[redacted]d   1. Labor: Continue to titrate pitocin  as necessary. Will plan recheck in 2 hours. 2. FWB: Cat 1 3. Pain: Per patient request 4. cholestasis in pregnancy: Normal labs, based on itching 5. Lupus: Established with rheumatology, not on any DMARDs, will follow up with rheumatology PP. Last flare 01/18/24   Anticipate SVB.  Geet Hosking VEAR Me, Student-MidWife 4:01 PM

## 2024-02-24 NOTE — Progress Notes (Signed)
 Tara Villanueva Tara Villanueva is a 33 y.o. [phone] at [redacted]w[redacted]d.  Subjective: Mild cramping. Coping with comfort measures.   Objective: BP 128/76   Pulse 79   Temp 97.8 F (36.6 C) (Oral)   Resp 18   Ht 5' 2 (1.575 m)   Wt 95 kg   LMP 06/03/2023   BMI 38.32 kg/m    FHT:  FHR: 135 bpm, variability: mod,  accelerations:  15x15,  decelerations:  bibe UC:   Q 1-4 minutes, mild-mod Dilation: 4 Effacement (%): 50 Cervical Position: Posterior Station: -3 Presentation: Vertex Exam by:: Shakira Los CNM AROM mod amount of clear fluid  Labs: Results for orders placed or performed during the hospital encounter of 02/24/24 (from the past 24 hours)  CBC     Status: Abnormal   Collection Time: 02/24/24  3:01 AM  Result Value Ref Range   WBC 8.7 4.0 - 10.5 K/uL   RBC 4.27 3.87 - 5.11 MIL/uL   Hemoglobin 11.6 (L) 12.0 - 15.0 g/dL   HCT 64.3 (L) 63.9 - 53.9 %   MCV 83.4 80.0 - 100.0 fL   MCH 27.2 26.0 - 34.0 pg   MCHC 32.6 30.0 - 36.0 g/dL   RDW 85.3 88.4 - 84.4 %   Platelets 269 150 - 400 K/uL   nRBC 0.0 0.0 - 0.2 %  Type and screen     Status: None   Collection Time: 02/24/24  3:01 AM  Result Value Ref Range   ABO/RH(D) O POS    Antibody Screen NEG    Sample Expiration      02/27/2024,2359 Performed at [facility], [address], [place], [place] [address]   RPR     Status: Abnormal   Collection Time: 02/24/24  3:01 AM  Result Value Ref Range   RPR Ser Ql Reactive (A) NON REACTIVE   RPR Titer 1:1     Assessment / Plan: [redacted]w[redacted]d week IUP ROM x Minutes: 16 Labor: Early. Progressing slowly on pitocin . Now AROM'd. Continue to titrate pitocin  to achieve adequate labor.  Cervix feels like there is a tight band of scar tissue at external os likely due to Hx LEEP.  Fetal Wellbeing:  Category I Pain Control:  IV Fentanyl  Anticipated MOD:  SVD False positive Syphilis  Claudene, Jawann Urbani , CNM 02/24/2024 8:22 PM

## 2024-02-24 NOTE — Progress Notes (Addendum)
 Labor Progress Note Tara Villanueva Tara Villanueva is a 33 y.o. H2E6966 at [redacted]w[redacted]d by LMP presented for IOL due to cholestasis and lupus flairs.  S:  Resting comfortably.  O:  BP 120/63   Pulse 80   Temp 98.1 F (36.7 C) (Oral)   Resp 18   Ht 5' 2 (1.575 m)   Wt 95 kg   LMP 06/03/2023   BMI 38.32 kg/m   EFM: baseline 130 bpm/ moderate variability/ 15x15 accels/ no decels  Toco/IUPC: occasional SVE: Dilation: 3.5 Effacement (%): Thick Cervical Position: Posterior Station: -3 Presentation: Vertex Exam by:: Conard Me SNM   A/P: 33 y.o. H2E6966 [redacted]w[redacted]d  1. Labor: s/p foley bulb. Discussed starting pitocin . Patient agreeable. 2. FWB: Cat 1 3. Pain: None #MOC: Undecided, concerned about effect on lupus #Circ:   No #cholestasis in pregnancy: Normal labs, based on itching #Lupus: Established with rheumatology, not on any DMARDs, will follow up with rheumatology PP. Last flare 01/18/24   Anticipate SVB.  Tara Villanueva Me, Student-MidWife 10:22 AM  I was present for the exam and agree with above.  Claudene, Jonerik Sliker , CNM 02/24/2024 2:40 PM

## 2024-02-25 ENCOUNTER — Encounter (HOSPITAL_COMMUNITY): Payer: Self-pay | Admitting: Family Medicine

## 2024-02-25 LAB — T.PALLIDUM AB, TOTAL: T Pallidum Abs: NONREACTIVE

## 2024-02-25 MED ORDER — SIMETHICONE 80 MG PO CHEW: 80.0000 mg | CHEWABLE_TABLET | ORAL | Status: DC | PRN

## 2024-02-25 MED ORDER — SENNOSIDES-DOCUSATE SODIUM 8.6-50 MG PO TABS
2.0000 | ORAL_TABLET | ORAL | Status: DC
  Filled 2024-02-25 (×2): qty 2

## 2024-02-25 MED ORDER — BENZOCAINE-MENTHOL 20-0.5 % EX AERO: 1.0000 | INHALATION_SPRAY | CUTANEOUS | Status: DC | PRN

## 2024-02-25 MED ORDER — COCONUT OIL OIL: 1.0000 | TOPICAL_OIL | Status: DC | PRN

## 2024-02-25 MED ORDER — DIBUCAINE (PERIANAL) 1 % EX OINT: 1.0000 | TOPICAL_OINTMENT | CUTANEOUS | Status: DC | PRN

## 2024-02-25 MED ORDER — PRENATAL MULTIVITAMIN CH
1.0000 | ORAL_TABLET | Freq: Every day | ORAL | Status: DC
  Administered 2024-02-25 – 2024-02-26 (×2): 1 via ORAL
  Filled 2024-02-25 (×2): qty 1

## 2024-02-25 MED ORDER — ACETAMINOPHEN 325 MG PO TABS
650.0000 mg | ORAL_TABLET | ORAL | Status: DC | PRN
  Filled 2024-02-25: qty 2

## 2024-02-25 MED ORDER — ONDANSETRON HCL 4 MG/2ML IJ SOLN: 4.0000 mg | INTRAMUSCULAR | Status: DC | PRN

## 2024-02-25 MED ORDER — WITCH HAZEL-GLYCERIN EX PADS: 1.0000 | MEDICATED_PAD | CUTANEOUS | Status: DC | PRN

## 2024-02-25 MED ORDER — OXYCODONE HCL 5 MG PO TABS
5.0000 mg | ORAL_TABLET | ORAL | Status: DC | PRN
  Filled 2024-02-25: qty 1

## 2024-02-25 MED ORDER — ONDANSETRON HCL 4 MG PO TABS: 4.0000 mg | ORAL_TABLET | ORAL | Status: DC | PRN

## 2024-02-25 MED ORDER — ZOLPIDEM TARTRATE 5 MG PO TABS: 5.0000 mg | ORAL_TABLET | Freq: Every evening | ORAL | Status: DC | PRN

## 2024-02-25 MED ORDER — IBUPROFEN 600 MG PO TABS
600.0000 mg | ORAL_TABLET | Freq: Four times a day (QID) | ORAL | Status: DC
  Administered 2024-02-25 – 2024-02-26 (×5): 600 mg via ORAL
  Filled 2024-02-25 (×5): qty 1

## 2024-02-25 MED ORDER — DIPHENHYDRAMINE HCL 25 MG PO CAPS: 25.0000 mg | ORAL_CAPSULE | Freq: Four times a day (QID) | ORAL | Status: DC | PRN

## 2024-02-25 MED ORDER — TETANUS-DIPHTH-ACELL PERTUSSIS 5-2-15.5 LF-MCG/0.5 IM SUSP: 0.5000 mL | Freq: Once | INTRAMUSCULAR | Status: DC

## 2024-02-25 NOTE — Progress Notes (Signed)
 Per patient she passed 2 quarter size blood clots when she used the bathroom. No clots  noted upon assessment. RN asked patient to call for RN to see clots if and when it happens again and not to flush the commode

## 2024-02-25 NOTE — Social Work (Signed)
 CSW received consult for hx of Anxiety and food insecurity.  CSW met with MOB to offer support and complete assessment.  CSW entered the room and observed MOB sitting in the chair feeding the infant, CSW introduced self, CSW role and reason for visit, MOB was agreeable to visit. CSW inquired about how MOB was feeling, MOB reported good. CSW inquired about not MH hx, MOB reported she had been diagnosed with Anxiety for years. MOB reported she does not currently take medication nor is she in therapy. MOB reported she keeps herself busy to cope. CSW inquired about increased anxiety symptoms during pregnancy, MOB reported life stressors such as losing her job and becoming a SAHM caused increase in anxiety and depression. CSW inquired about MOB current mood, MOB reported feeling okay.  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 CSW provided education regarding the baby blues period vs. perinatal mood disorders, discussed treatment and gave resources for mental health follow up if concerns arise.  CSW recommends self-evaluation during the postpartum time period using the New Mom Checklist from Postpartum Progress and encouraged MOB to contact a medical professional if symptoms are noted at any time.  MOB identified her best friend as her primary support.  CSW provided review of Sudden Infant Death Syndrome (SIDS) precautions.  MOB reported she as all essential items for the infant including a bassinet and car seat.  CSW identifies no further need for intervention and no barriers to discharge at this time.  Toni Hoffmeister, LCSWA Clinical Social Worker [phone]

## 2024-02-25 NOTE — Progress Notes (Addendum)
 POSTPARTUM PROGRESS NOTE  Post Partum Day 1  Subjective:  Tara Villanueva is a 33 y.o. H2E5965 s/p VD at [redacted]w[redacted]d.  No acute events overnight.  Pt denies problems with ambulating, voiding or po intake.  She denies nausea or vomiting.  Pain is well controlled.  She has had flatus. Lochia Minimal.   Objective: Blood pressure 123/89, pulse 71, temperature 98.7 F (37.1 C), temperature source Oral, resp. rate 18, height 5' 2 (1.575 m), weight 95 kg, last menstrual period 06/03/2023, SpO2 97%, unknown if currently breastfeeding.  Physical Exam:  General: alert, cooperative and no distress Chest: no respiratory distress Heart:regular rate, distal pulses intact Abdomen: soft, nontender,  Uterine Fundus: firm, appropriately tender DVT Evaluation: No calf swelling or tenderness Extremities: no edema Skin: warm, dry  Recent Labs    02/24/24 0301  HGB 11.6*  HCT 35.6*    Assessment/Plan: Tara Villanueva is a 33 y.o. H2E5965 s/p VD at [redacted]w[redacted]d   PPD#1 - Doing well. No concerns. Pain and bleeding are appropriate. Contraception: unsure. Interested in depo (previously tolerated) but has been told by her doctor that due to her vasculitic disease (lupus) she should not be on depo.  She is aware of all of her options and is going to keep thinking about it.  Feeding: both Dispo: Plan for discharge PPD2.   LOS: 1 day   [name], MD Family Medicine PGY-1  02/25/2024 10:42 AM  Attestation of Supervision of Resident: Evaluation and management procedures were performed by the learners: Family Medicine Resident under my supervision. I was immediately available for direct supervision, assistance and direction throughout this encounter.  I also confirm that I have verified the information documented in the residents note, and that I have also personally reperformed the pertinent components of the physical exam and all of the medical decision making activities.  I  have also made any necessary editorial changes.  Barabara Maier, DO FM-OB Fellow Center for [employer]

## 2024-02-25 NOTE — Lactation Note (Signed)
 This note was copied from a baby's chart. Lactation Consultation Note  Patient Name: Tara Villanueva Unijb'd Date: 02/25/2024 Age:31 hours Reason for consult: Initial assessment;Early term 37-38.6wks  P4 experienced BF mom but it has been 12 yrs since last BF her youngest child. Mom this has been BF well w/o difficulty. Mom denies painful latches.  Newborn feeding habits, behavior, STS, I&O reviewed. Mom encouraged to feed baby 8-12 times/24 hours and with feeding cues.  Wake if baby hasn't cued to feed in 3 hrs. Mom states she doesn't have questions or concerns at this time. Mom is tired and wants to go sleep. LC didn't get to see a latch. Maternal Data Does the patient have breastfeeding experience prior to this delivery?: Yes How long did the patient breastfeed?: 1st- 1 yr, 2nd-52months, 3rd-38months  Feeding    LATCH Score                    Lactation Tools Discussed/Used    Interventions Interventions: Breast feeding basics reviewed;Education;LC Services brochure  Discharge    Consult Status Consult Status: Follow-up Date: 02/25/24 Follow-up type: In-patient    Tara Villanueva 02/25/2024, 3:22 AM

## 2024-02-26 ENCOUNTER — Other Ambulatory Visit (HOSPITAL_COMMUNITY): Payer: Self-pay

## 2024-02-26 MED ORDER — ACETAMINOPHEN 325 MG PO TABS
650.0000 mg | ORAL_TABLET | ORAL | 0 refills | Status: AC | PRN
  Filled 2024-02-26: qty 30, 3d supply, fill #0

## 2024-02-26 MED ORDER — PRENATAL MULTIVITAMIN CH
1.0000 | ORAL_TABLET | Freq: Every day | ORAL | 3 refills | Status: AC
  Filled 2024-02-26: qty 90, 90d supply, fill #0

## 2024-02-26 MED ORDER — SENNOSIDES-DOCUSATE SODIUM 8.6-50 MG PO TABS
2.0000 | ORAL_TABLET | ORAL | 0 refills | Status: AC
  Filled 2024-02-26: qty 30, 15d supply, fill #0

## 2024-02-26 MED ORDER — IBUPROFEN 600 MG PO TABS
600.0000 mg | ORAL_TABLET | Freq: Four times a day (QID) | ORAL | 0 refills | Status: AC
  Filled 2024-02-26: qty 30, 8d supply, fill #0

## 2024-02-26 MED ORDER — SIMETHICONE 80 MG PO CHEW
80.0000 mg | CHEWABLE_TABLET | ORAL | 0 refills | Status: AC | PRN
  Filled 2024-02-26: qty 30, 30d supply, fill #0

## 2024-02-26 NOTE — Lactation Note (Signed)
 This note was copied from a baby's chart. Lactation Consultation Note  Patient Name: Tara Villanueva Unijb'd Date: 02/26/2024 Age:31 hours Reason for consult: Follow-up assessment;Early term 37-38.6wks  P4 mom c/o sore nipples. Nipples are intact. No redness or skin breakdown noted. Mom is applying colostrum and letting dry the coconut oil. LC gave mom shells to wear as well. Plan: BF baby then apply colostrum to nipples and let dry. Then apply coconut oil and shells. Mom appreciative. Made suggestions about cheeks to breast, not being able to see mouth during BF, supportive props. Encouraged to call for latch assistance. Maternal Data    Feeding    LATCH Score Latch: (P) Grasps breast easily, tongue down, lips flanged, rhythmical sucking.  Audible Swallowing: (P) Spontaneous and intermittent  Type of Nipple: Everted at rest and after stimulation  Comfort (Breast/Nipple): Filling, red/small blisters or bruises, mild/mod discomfort (intact but sore)  Hold (Positioning): (P) No assistance needed to correctly position infant at breast.  LATCH Score: (P) 9   Lactation Tools Discussed/Used    Interventions Interventions: Hand express;Coconut oil;Shells  Discharge    Consult Status Consult Status: Follow-up Date: 02/26/24 Follow-up type: In-patient    Louay Myrie G 02/26/2024, 12:34 AM

## 2024-02-26 NOTE — Lactation Note (Signed)
 This note was copied from a baby's chart. Lactation Consultation Note  Patient Name: Tara Villanueva Unijb'd Date: 02/26/2024 Age:31 hours Reason for consult: Follow-up assessment;Early term 37-38.6wks;Infant < 6lbs  P4, Mother latching baby when [facility] entered the room. Placed pillow under baby to bring him to nipple height. Lip flanged.  Intermittent swallows observed with good depth on the breast. Mother states nipples are tender for the first few minutes of latching but is improving.  Discussed when to call for help. She has coconut oil. Discussed feeding baby on demand at least 8-12 times a day. Mother was worried about the volume baby was getting so she started supplementing with formula last night. Encouraged offering the breast first to stimulate her supply. Pump or hand express if giving formula.  Reviewed engorgement care and monitoring voids/stools.   Maternal Data Has patient been taught Hand Expression?: Yes Does the patient have breastfeeding experience prior to this delivery?: Yes  Feeding Mother's Current Feeding Choice: Breast Milk and Formula  LATCH Score Latch: Grasps breast easily, tongue down, lips flanged, rhythmical sucking.  Audible Swallowing: A few with stimulation  Type of Nipple: Everted at rest and after stimulation  Comfort (Breast/Nipple): Filling, red/small blisters or bruises, mild/mod discomfort  Hold (Positioning): No assistance needed to correctly position infant at breast.  LATCH Score: 8   Lactation Tools Discussed/Used Tools: Pump Breast pump type: Manual Pumping frequency: PRN  Interventions Interventions: Education;Breast feeding basics reviewed  Discharge Discharge Education: Engorgement and breast care;Warning signs for feeding baby Pump: Manual;Personal  Consult Status Consult Status: Complete Date: 02/26/24    Shannon Dines [facility] 02/26/2024, 10:23 AM

## 2024-02-26 NOTE — Patient Instructions (Signed)

## 2024-02-26 NOTE — Progress Notes (Signed)
 Pt's Edinburgh score is 12. She has already been seen by the social worker who identified no barriers to discharge.

## 2024-03-01 ENCOUNTER — Encounter: Payer: Self-pay | Admitting: Certified Nurse Midwife

## 2024-03-01 ENCOUNTER — Other Ambulatory Visit: Payer: Self-pay

## 2024-03-02 ENCOUNTER — Telehealth (HOSPITAL_COMMUNITY): Payer: Self-pay | Admitting: *Deleted

## 2024-03-02 NOTE — Telephone Encounter (Signed)
 03/02/2024  Name: Tara Villanueva MRN: [id number] DOB: Jan 29, 1991  Reason for Call:  Transition of Care Hospital Discharge Call  Contact Status: Patient Contact Status: Complete  Language assistant needed:          Follow-Up Questions: Do You Have Any Concerns About Your Health As You Heal From Delivery?: Yes What Concerns Do You Have About Your Health?: Patient c/o lower abdominal pressure. Denied signs of UTI. Reported bleeding is decreasing. No large clots reported. No fever. No stitches reported. RN instructed patient to call her OB if symptoms do not improve. Patient verbalized understanding. No other questions or concerns voiced at this time. Do You Have Any Concerns About Your Infants Health?: No  Edinburgh Postnatal Depression Scale:  In the Past 7 Days: I have been able to laugh and see the funny side of things.: Not quite so much now I have looked forward with enjoyment to things.: Definitely less than I used to I have blamed myself unnecessarily when things went wrong.: Yes, some of the time I have been anxious or worried for no good reason.: Yes, very often I have felt scared or panicky for no good reason.: No, not much Things have been getting on top of me.: Yes, sometimes I haven't been coping as well as usual I have been so unhappy that I have had difficulty sleeping.: Not very often I have felt sad or miserable.: Yes, quite often I have been so unhappy that I have been crying.: Only occasionally The thought of harming myself has occurred to me.: Never Van Postnatal Depression Scale Total: (!) 15 Patient stated that she has been referred to [facility], and that she did not keep the appointment. RN verified information. Patient FTKA with [facility] on 01/06/24. RN informed on-call provider, Dr. Herchel, of patient's EPDS score today via secure chat. Patient continues to decline IBH services, stating, I just don't want to talk with anybody  about what is going on. Patient denied SI. Patient agreed for RN to email a list of maternal mental health resources. Sent by RN.  PHQ2-9 Depression Scale:     Discharge Follow-up: Edinburgh score requires follow up?: Yes Provider notified of Edinburgh score?: Yes Have you already been referred for a counseling appointment?: Yes Date of appointment:: 01/06/24 If appointment has already passed, how did it go?: Patient failed to keep appointment. Patient was advised of the following resources:: Other (comment)  Post-discharge interventions: Reviewed Newborn Safe Sleep Practices Maternal Mental Health Resources provided  Signature Allean IVAR Carton, RN, 03/02/24, [phone]

## 2024-03-08 ENCOUNTER — Encounter: Payer: Self-pay | Admitting: Student

## 2024-03-15 NOTE — Telephone Encounter (Signed)
 Spoke with pt regarding flu shot during postpartum period.  She states unable to get at pharmacy due to no insurance.  Advised pt to check with [facility] or local flu shot clinics.   [place], RN

## 2024-03-16 ENCOUNTER — Other Ambulatory Visit: Payer: Self-pay

## 2024-03-16 ENCOUNTER — Encounter: Payer: Self-pay | Admitting: Family Medicine

## 2024-03-30 ENCOUNTER — Ambulatory Visit: Payer: Self-pay | Admitting: Obstetrics and Gynecology

## 2024-04-20 ENCOUNTER — Ambulatory Visit: Payer: Self-pay | Admitting: Nurse Practitioner

## 2024-04-20 NOTE — Progress Notes (Unsigned)
{  SEHM (Optional):34217}  Tara Villanueva Doing, DNP, AGNP-c [facility] [address] [place], [place] [address] [phone]   ACUTE VISIT : Acute or New Concern Visit on 04/20/2024  Last menstrual period 06/03/2023, unknown if currently breastfeeding.   Subjective:  No chief complaint on file.  ***  ROS negative except for what is listed in HPI. History, Medications, Surgery, SDOH, and Family History reviewed and updated as appropriate.  Objective:  Physical Exam Vitals and nursing note reviewed.  Constitutional:      Appearance: Normal appearance.  HENT:     Head: Normocephalic.  Eyes:     Pupils: Pupils are equal, round, and reactive to light.  Cardiovascular:     Rate and Rhythm: Normal rate and regular rhythm.     Pulses: Normal pulses.     Heart sounds: Normal heart sounds.  Pulmonary:     Effort: Pulmonary effort is normal.     Breath sounds: Normal breath sounds.  Musculoskeletal:        General: Normal range of motion.     Cervical back: Normal range of motion.  Skin:    General: Skin is warm.  Neurological:     General: No focal deficit present.     Mental Status: She is alert and oriented to person, place, and time.  Psychiatric:        Mood and Affect: Mood normal.         Assessment & Plan:   Assessment & Plan Alteration in blood pressure       Tara Villanueva Doing, DNP, AGNP-c  {SETIMEYorN (Optional):34216}

## 2024-04-22 ENCOUNTER — Ambulatory Visit: Payer: Self-pay | Admitting: Student

## 2024-04-27 ENCOUNTER — Ambulatory Visit: Payer: Self-pay | Admitting: Obstetrics and Gynecology

## 2024-06-03 ENCOUNTER — Ambulatory Visit: Payer: Self-pay | Admitting: Nurse Practitioner
